# Patient Record
Sex: Female | Born: 1955 | Race: White | Hispanic: No | Marital: Single | Smoking: Current every day smoker
Health system: Southern US, Community
[De-identification: ages and names within clinical notes are randomized; demographics above are authoritative.]

## PROBLEM LIST (undated history)

## (undated) DIAGNOSIS — D490 Neoplasm of unspecified behavior of digestive system: Secondary | ICD-10-CM

## (undated) DIAGNOSIS — F419 Anxiety disorder, unspecified: Secondary | ICD-10-CM

## (undated) DIAGNOSIS — E222 Syndrome of inappropriate secretion of antidiuretic hormone: Secondary | ICD-10-CM

## (undated) DIAGNOSIS — I10 Essential (primary) hypertension: Secondary | ICD-10-CM

## (undated) DIAGNOSIS — F32A Depression, unspecified: Secondary | ICD-10-CM

## (undated) HISTORY — DX: Syndrome of inappropriate secretion of antidiuretic hormone: E22.2

## (undated) HISTORY — DX: Anxiety disorder, unspecified: F41.9

## (undated) HISTORY — DX: Neoplasm of unspecified behavior of digestive system: D49.0

## (undated) HISTORY — PX: MANDIBLE FRACTURE SURGERY: SHX706

## (undated) HISTORY — DX: Depression, unspecified: F32.A

## (undated) HISTORY — PX: LEEP: SHX91

## (undated) HISTORY — PX: COLONOSCOPY: SHX174

---

## 2004-05-29 DIAGNOSIS — A483 Toxic shock syndrome: Secondary | ICD-10-CM

## 2004-05-29 HISTORY — DX: Toxic shock syndrome: A48.3

## 2009-04-19 ENCOUNTER — Ambulatory Visit (HOSPITAL_COMMUNITY): Admission: RE | Admit: 2009-04-19 | Discharge: 2009-04-19 | Payer: Self-pay | Admitting: Internal Medicine

## 2009-06-16 ENCOUNTER — Other Ambulatory Visit: Admission: RE | Admit: 2009-06-16 | Discharge: 2009-06-16 | Payer: Self-pay | Admitting: Internal Medicine

## 2009-08-04 ENCOUNTER — Ambulatory Visit: Payer: Self-pay | Admitting: Gastroenterology

## 2009-08-04 DIAGNOSIS — I10 Essential (primary) hypertension: Secondary | ICD-10-CM | POA: Insufficient documentation

## 2009-08-04 DIAGNOSIS — D126 Benign neoplasm of colon, unspecified: Secondary | ICD-10-CM | POA: Insufficient documentation

## 2009-08-24 ENCOUNTER — Ambulatory Visit: Payer: Self-pay | Admitting: Gastroenterology

## 2009-08-24 ENCOUNTER — Ambulatory Visit (HOSPITAL_COMMUNITY): Admission: RE | Admit: 2009-08-24 | Discharge: 2009-08-24 | Payer: Self-pay | Admitting: Gastroenterology

## 2009-08-25 ENCOUNTER — Encounter (INDEPENDENT_AMBULATORY_CARE_PROVIDER_SITE_OTHER): Payer: Self-pay

## 2010-06-28 NOTE — Letter (Signed)
Summary: TCS ORDER  TCS ORDER   Imported By: Ave Filter 08/04/2009 09:56:04  _____________________________________________________________________  External Attachment:    Type:   Image     Comment:   External Document

## 2010-06-28 NOTE — Letter (Signed)
Summary: Patient Notice, Colon Biopsy Results  [facility]  [address]   [place], [place] [address]   Phone: [phone]  Fax: [phone]       August 25, 2009   [facility] [address] [place], [place]  [address] March 13, 1956    Dear Ms. Laursen,  I am pleased to inform you that the biopsies taken during your recent colonoscopy did not show any evidence of cancer upon pathologic examination.  Additional information/recommendations:  __Please follow a high fiber diet  __You should have a repeat colonoscopy examination  in 5 years.  Please call us if you are having persistent problems or have questions about your condition that have not been fully answered at this time.  Sincerely,    Hendricks Limes LPN  [facility] Ph: [phone]    Fax: [phone]

## 2010-06-28 NOTE — Assessment & Plan Note (Signed)
Summary: hx of polyps.glu   Visit Type:  Initial Consult Referring Provider:  [facility] Primary Care Provider:  [facility]  Chief Complaint:  Hx adenomatous polyps.  History of Present Illness: 55 y/o caucasian female due for surveillance colonoscopy.  1st colonoscopy 5 polyps removed in 1994 at [facility], 2nd colonoscopy 1999->normal.  Mother succumbed secondary to colon CA dx age 45.  The patient denies abdominal pain, nausea, vomiting, bloating, change in bowel habits, blood in stool, constipation, and diarrhea.  The patient denies any upper GI symptoms which include heartburn, indigestion, dysphagia, odynophagia, anorexia or early satiety.  Weight stable.   Current Problems (verified): 1)  Hypertension  (ICD-401.9) 2)  Colonic Polyps, Adenomatous  (ICD-211.3)  Current Medications (verified): 1)  Enalapril Maleate 20 Mg Tabs (Enalapril Maleate) .... 2 By Mouth Daily 2)  Trazodone Hcl .Marland Kitchen.. 1 By Mouth At Bedtime As Needed Insomnia 3)  Ibuprofen 200 Mg Tabs (Ibuprofen) .... Prn 4)  Multivitamins  Tabs (Multiple Vitamin) .Marland Kitchen.. 1 By Mouth Daily 5)  Vit C .... 1 By Mouth Daily 6)  Refresh 1.4-0.6 % Soln (Polyvinyl Alcohol-Povidone) .... Prn  Allergies (verified): No Known Drug Allergies  Past History:  Past Medical History: HYPERTENSION (ICD-401.9) COLONIC POLYPS, ADENOMATOUS (ICD-211.3) see HPI  Past Surgical History: Fx jaw age 45  Family History: Mother succumber colon CA age 41 Father (deceased 68)  LUNG ca? 1 Sister, brother->healthy  Social History: single, lives alone Work [employer] facility Hx alcohol abuse Remote hx marijuana use 20 pkyr hx tobacco abuse    Review of Systems      See HPI General:  Denies fever, chills, sweats, anorexia, fatigue, weakness, malaise, weight loss, and sleep disorder. CV:  Denies chest pains, angina, palpitations, syncope, dyspnea on exertion, orthopnea, PND, peripheral edema, and claudication. Resp:   Denies dyspnea at rest, dyspnea with exercise, cough, sputum, wheezing, coughing up blood, and pleurisy. GI:  Denies difficulty swallowing, pain on swallowing, jaundice, and fecal incontinence. GU:  Denies urinary burning, blood in urine, nocturnal urination, urinary frequency, and urinary incontinence. Derm:  Denies rash, itching, dry skin, hives, moles, warts, and unhealing ulcers. Psych:  Denies depression, anxiety, memory loss, suicidal ideation, hallucinations, paranoia, phobia, and confusion. Heme:  Denies bruising, bleeding, and enlarged lymph nodes.  Vital Signs:  Patient profile:   55 year old female Height:      67 inches Weight:      129 pounds BMI:     20.28 Temp:     97.9 degrees F Pulse rate:   60 / minute BP supine:   106 / 78  Physical Exam  General:  Well developed, well nourished, no acute distress. Head:  Normocephalic and atraumatic. Eyes:  Sclera clear, no icterus. Ears:  Normal auditory acuity. Nose:  No deformity, discharge,  or lesions. Mouth:  No deformity or lesions, dentition normal. Neck:  Supple; no masses or thyromegaly. Chest Wall:  Symmetrical;  no deformities or tenderness. Lungs:  Clear throughout to auscultation. Heart:  Regular rate and rhythm; no murmurs, rubs,  or bruits. Abdomen:  Soft, nontender and nondistended. No masses, hepatosplenomegaly or hernias noted. Normal bowel sounds.without guarding and without rebound.   Msk:  Symmetrical with no gross deformities. Normal posture. Pulses:  Normal pulses noted. Extremities:  No clubbing, cyanosis, edema or deformities noted. Neurologic:  Alert and  oriented x4;  grossly normal neurologically. Skin:  Intact without significant lesions or rashes. Cervical Nodes:  No significant cervical adenopathy. Psych:  Alert  and cooperative. Normal mood and affect.  Impression & Recommendations:  Problem # 1:  Hx COLONIC POLYPS, ADENOMATOUS (ICD-211.3) 55 y/o caucasian female w/ hx polyps and family  history colon cancer in her mother.  Overdue for colonoscopy.  Colonoscopy to be performed by Dr. Jonette Eva in the near future.  I have discussed risks and benefits which include, but are not limited to, bleeding, infection, perforation, or medication reaction.  The patient agrees with this plan and consent will be obtained.  PT REQUESTS NO IV's in LEFT ARM given hx problems during previous colonoscopy.  Orders: Consultation Level III [phone])

## 2012-12-04 ENCOUNTER — Emergency Department (HOSPITAL_COMMUNITY): Payer: Worker's Compensation

## 2012-12-04 ENCOUNTER — Encounter (HOSPITAL_COMMUNITY): Payer: Self-pay

## 2012-12-04 ENCOUNTER — Emergency Department (HOSPITAL_COMMUNITY)
Admission: EM | Admit: 2012-12-04 | Discharge: 2012-12-04 | Disposition: A | Discharge status: Home / Self Care | Payer: Worker's Compensation | Attending: Emergency Medicine | Admitting: Emergency Medicine

## 2012-12-04 DIAGNOSIS — R51 Headache: Secondary | ICD-10-CM | POA: Insufficient documentation

## 2012-12-04 DIAGNOSIS — R040 Epistaxis: Secondary | ICD-10-CM | POA: Insufficient documentation

## 2012-12-04 DIAGNOSIS — Y9229 Other specified public building as the place of occurrence of the external cause: Secondary | ICD-10-CM | POA: Insufficient documentation

## 2012-12-04 DIAGNOSIS — W07XXXA Fall from chair, initial encounter: Secondary | ICD-10-CM | POA: Insufficient documentation

## 2012-12-04 DIAGNOSIS — F17200 Nicotine dependence, unspecified, uncomplicated: Secondary | ICD-10-CM | POA: Insufficient documentation

## 2012-12-04 DIAGNOSIS — I10 Essential (primary) hypertension: Secondary | ICD-10-CM | POA: Insufficient documentation

## 2012-12-04 DIAGNOSIS — Y990 Civilian activity done for income or pay: Secondary | ICD-10-CM | POA: Insufficient documentation

## 2012-12-04 DIAGNOSIS — R5381 Other malaise: Secondary | ICD-10-CM | POA: Insufficient documentation

## 2012-12-04 DIAGNOSIS — S022XXA Fracture of nasal bones, initial encounter for closed fracture: Secondary | ICD-10-CM

## 2012-12-04 DIAGNOSIS — M542 Cervicalgia: Secondary | ICD-10-CM | POA: Insufficient documentation

## 2012-12-04 DIAGNOSIS — R5383 Other fatigue: Secondary | ICD-10-CM | POA: Insufficient documentation

## 2012-12-04 DIAGNOSIS — M6281 Muscle weakness (generalized): Secondary | ICD-10-CM | POA: Insufficient documentation

## 2012-12-04 DIAGNOSIS — Y9389 Activity, other specified: Secondary | ICD-10-CM | POA: Insufficient documentation

## 2012-12-04 DIAGNOSIS — M25579 Pain in unspecified ankle and joints of unspecified foot: Secondary | ICD-10-CM | POA: Insufficient documentation

## 2012-12-04 HISTORY — DX: Essential (primary) hypertension: I10

## 2012-12-04 MED ORDER — OXYCODONE-ACETAMINOPHEN 5-325 MG PO TABS: 1.0000 | ORAL_TABLET | ORAL | Status: DC | PRN

## 2012-12-04 NOTE — ED Notes (Signed)
Pt was working at [employer] and slipped off of chair.  Hit face on back of chair and had nose bleed from both nostrils.  C/O left foot feeling sore.  C/O headache.    Pt says feels "tired" and "groggy."

## 2012-12-04 NOTE — ED Provider Notes (Signed)
History  This chart was scribed for Joann Hutching, MD by Ardelia Mems, ED Scribe. This patient was seen in room APA02/APA02 and the patient's care was started at 4:01 PM.  CSN: [id number]  Arrival date & time 12/04/12  1531   No chief complaint on file.  The history is provided by the patient. No language interpreter was used.   HPI Comments: [name] is a 57 y.o. female with a hx of HTN who presents to the Emergency Department complaining of a nosebleed onset after an accidental fall that occurred earlier today, while at work. Pt states that she stepped up on a chair with wooden arms to take a wall clock off of the wall, lost her balance and fell, and hit the bridge of her nose on the wooden arms of the chair. Pt reports "gushing blood", which is now controlled. Pt states that co-workers applied ice and packed her nose to stop the bleeding. Pt also complains of constant, mild headache and neck pain and constant, mild left ankle pain, described as soreness, which she believes is related to the fall. Pt states that she ate well today, but reports feeling weak since the fall. Pt doesn't recall any blood running down her throat. Pt denies LOC, vomiting, visual disturbance, abdomina pain, back pain or any other symptoms. Pt denies alcohol use and is a current every day smoker.   PCP- None   Past Medical History  Diagnosis Date  . Hypertension    Past Surgical History  Procedure Laterality Date  . Colonoscopy    . Mandible fracture surgery    . Leep     No family history on file. History  Substance Use Topics  . Smoking status: Current Every Day Smoker  . Smokeless tobacco: Not on file  . Alcohol Use: No   OB History   Grav Para Term Preterm Abortions TAB SAB Ect Mult Living                 Review of Systems  Constitutional: Positive for fatigue. Negative for fever and chills.  HENT: Positive for nosebleeds and neck pain. Negative for congestion, sore throat and  rhinorrhea.   Eyes: Negative for visual disturbance.  Respiratory: Negative for cough and shortness of breath.   Cardiovascular: Negative for chest pain.  Gastrointestinal: Negative for nausea, vomiting, abdominal pain and diarrhea.  Genitourinary: Negative for dysuria.  Musculoskeletal: Negative for back pain.  Skin: Negative for rash.  Neurological: Positive for weakness and headaches. Negative for syncope.  Psychiatric/Behavioral: Negative for confusion.  A complete 10 system review of systems was obtained and all systems are negative except as noted in the HPI and PMH.   Allergies  Review of patient's allergies indicates no known allergies.  Home Medications  No current outpatient prescriptions on file.  Triage Vitals: BP 129/81  Pulse 80  Temp(Src) 98.2 F (36.8 C) (Oral)  Resp 18  Ht 5\' 7"  (1.702 m)  Wt 140 lb (63.504 kg)  BMI 21.92 kg/m2  SpO2 100%  Physical Exam  Nursing note and vitals reviewed. Constitutional: She is oriented to person, place, and time. She appears well-developed and well-nourished.  HENT:  Head: Normocephalic and atraumatic.  Mild tenderness of nasal bridge to palpation. Coagulated blood, more in right nares than left.  Eyes: Conjunctivae and EOM are normal. Pupils are equal, round, and reactive to light.  Neck: Normal range of motion. Neck supple.  Mild tenderness of C-spine to palpation.  Cardiovascular: Normal rate, regular  rhythm and normal heart sounds.   Pulmonary/Chest: Effort normal and breath sounds normal.  Abdominal: Soft. Bowel sounds are normal.  Musculoskeletal: Normal range of motion.  Minimal tenderness around left ankle.   Neurological: She is alert and oriented to person, place, and time.  Skin: Skin is warm and dry.  Psychiatric: She has a normal mood and affect.    ED Course  Procedures (including critical care time)  DIAGNOSTIC STUDIES: Oxygen Saturation is 100% on RA, normal by my interpretation.    COORDINATION  OF CARE: 4:07 PM- Pt advised of plan to receive maxillofacial, cervical and head CTs as well as radiology of her left ankle and pt agrees.  6:50 PM- Pt informed of radiology findings, specifically her nasal bon fracture. Pt advised to follow-up with an ENT and pt agrees.   Labs Reviewed - No data to display  Dg Ankle Complete Left  12/04/2012   *RADIOLOGY REPORT*  Clinical Data: Twisting injury.  Pain.  LEFT ANKLE COMPLETE - 3+ VIEW  Comparison: None.  Findings: No fracture or dislocation.  IMPRESSION: No fracture.   Original Report Authenticated By: Lacy Duverney, M.D.   Ct Head Wo Contrast  12/04/2012   *RADIOLOGY REPORT*  Clinical Data:  Fall.  Nose bleed.  CT HEAD WITHOUT CONTRAST CT MAXILLOFACIAL WITHOUT CONTRAST CT CERVICAL SPINE WITHOUT CONTRAST  Technique:  Multidetector CT imaging of the head, cervical spine, and maxillofacial structures were performed using the standard protocol without intravenous contrast. Multiplanar CT image reconstructions of the cervical spine and maxillofacial structures were also generated.  Comparison:   None  CT HEAD  Findings: No acute intracranial abnormality.  Specifically, no hemorrhage, hydrocephalus, mass lesion, acute infarction, or significant intracranial injury.  No acute calvarial abnormality. Visualized paranasal sinuses and mastoids clear.  Orbital soft tissues unremarkable.  IMPRESSION: No acute intracranial abnormality.  CT MAXILLOFACIAL  Findings:  Mild mucosal thickening within the left maxillary sinus. No air fluid levels.  Nasal bone fractures are noted, slightly displaced and angulated on the right.  Zygomatic arches and mandible are intact.  Orbital soft tissues unremarkable.  IMPRESSION: Mildly displaced and angulated nasal bone fractures.  CT CERVICAL SPINE  Findings:   Loss of normal cervical lordosis.  Degenerative disc disease changes at C5-6.  Mild degenerative facet disease at this level bilaterally.  No fracture.  No epidural or paraspinal  hematoma.  No subluxation.  Prevertebral soft tissues are normal.  IMPRESSION: Mild degenerative changes.  No acute findings.   Original Report Authenticated By: Charlett Nose, M.D.   Ct Cervical Spine Wo Contrast  12/04/2012   *RADIOLOGY REPORT*  Clinical Data:  Fall.  Nose bleed.  CT HEAD WITHOUT CONTRAST CT MAXILLOFACIAL WITHOUT CONTRAST CT CERVICAL SPINE WITHOUT CONTRAST  Technique:  Multidetector CT imaging of the head, cervical spine, and maxillofacial structures were performed using the standard protocol without intravenous contrast. Multiplanar CT image reconstructions of the cervical spine and maxillofacial structures were also generated.  Comparison:   None  CT HEAD  Findings: No acute intracranial abnormality.  Specifically, no hemorrhage, hydrocephalus, mass lesion, acute infarction, or significant intracranial injury.  No acute calvarial abnormality. Visualized paranasal sinuses and mastoids clear.  Orbital soft tissues unremarkable.  IMPRESSION: No acute intracranial abnormality.  CT MAXILLOFACIAL  Findings:  Mild mucosal thickening within the left maxillary sinus. No air fluid levels.  Nasal bone fractures are noted, slightly displaced and angulated on the right.  Zygomatic arches and mandible are intact.  Orbital soft tissues unremarkable.  IMPRESSION:  Mildly displaced and angulated nasal bone fractures.  CT CERVICAL SPINE  Findings:   Loss of normal cervical lordosis.  Degenerative disc disease changes at C5-6.  Mild degenerative facet disease at this level bilaterally.  No fracture.  No epidural or paraspinal hematoma.  No subluxation.  Prevertebral soft tissues are normal.  IMPRESSION: Mild degenerative changes.  No acute findings.   Original Report Authenticated By: Charlett Nose, M.D.   Ct Maxillofacial Wo Cm  12/04/2012   *RADIOLOGY REPORT*  Clinical Data:  Fall.  Nose bleed.  CT HEAD WITHOUT CONTRAST CT MAXILLOFACIAL WITHOUT CONTRAST CT CERVICAL SPINE WITHOUT CONTRAST  Technique:   Multidetector CT imaging of the head, cervical spine, and maxillofacial structures were performed using the standard protocol without intravenous contrast. Multiplanar CT image reconstructions of the cervical spine and maxillofacial structures were also generated.  Comparison:   None  CT HEAD  Findings: No acute intracranial abnormality.  Specifically, no hemorrhage, hydrocephalus, mass lesion, acute infarction, or significant intracranial injury.  No acute calvarial abnormality. Visualized paranasal sinuses and mastoids clear.  Orbital soft tissues unremarkable.  IMPRESSION: No acute intracranial abnormality.  CT MAXILLOFACIAL  Findings:  Mild mucosal thickening within the left maxillary sinus. No air fluid levels.  Nasal bone fractures are noted, slightly displaced and angulated on the right.  Zygomatic arches and mandible are intact.  Orbital soft tissues unremarkable.  IMPRESSION: Mildly displaced and angulated nasal bone fractures.  CT CERVICAL SPINE  Findings:   Loss of normal cervical lordosis.  Degenerative disc disease changes at C5-6.  Mild degenerative facet disease at this level bilaterally.  No fracture.  No epidural or paraspinal hematoma.  No subluxation.  Prevertebral soft tissues are normal.  IMPRESSION: Mild degenerative changes.  No acute findings.   Original Report Authenticated By: Charlett Nose, M.D.    No diagnosis found.  MDM  Pt is alert and oriented with no neuro deficits.   Discussed nasal fracture.    Patient will followup with otolaryngologist.   Pain medication         I personally performed the services described in this documentation, which was scribed in my presence. The recorded information has been reviewed and is accurate.    Joann Hutching, MD 12/04/12 [phone]

## 2012-12-04 NOTE — ED Notes (Signed)
Pt answers questions appropriately but says doesn't feel like herself and isn't talking like normal.  Pt also requested that she not be given any narcotics.

## 2012-12-04 NOTE — ED Notes (Signed)
Patient refused prescription of percocet. Notified md. Placed in scredder

## 2013-10-14 ENCOUNTER — Other Ambulatory Visit (HOSPITAL_COMMUNITY): Payer: Self-pay | Admitting: Physician Assistant

## 2013-10-14 DIAGNOSIS — Z1231 Encounter for screening mammogram for malignant neoplasm of breast: Secondary | ICD-10-CM

## 2014-03-23 ENCOUNTER — Ambulatory Visit (HOSPITAL_COMMUNITY)
Admission: RE | Admit: 2014-03-23 | Discharge: 2014-03-23 | Disposition: A | Discharge status: Home / Self Care | Payer: Self-pay | Source: Ambulatory Visit | Attending: Physician Assistant | Admitting: Physician Assistant

## 2014-03-23 ENCOUNTER — Ambulatory Visit (HOSPITAL_COMMUNITY): Payer: Self-pay

## 2014-03-23 DIAGNOSIS — Z1231 Encounter for screening mammogram for malignant neoplasm of breast: Secondary | ICD-10-CM

## 2014-07-20 IMAGING — CT CT HEAD W/O CM
4 of 9 series · 15 of 47 positions shown, 18 images · non-contrast
Comparison: None

CT HEAD

CLINICAL DATA: Fall.  Nose bleed.

CT HEAD WITHOUT CONTRAST
CT MAXILLOFACIAL WITHOUT CONTRAST
CT CERVICAL SPINE WITHOUT CONTRAST
TECHNIQUE: Multidetector CT imaging of the head, cervical spine,
and maxillofacial structures were performed using the standard
protocol without intravenous contrast. Multiplanar CT image
reconstructions of the cervical spine and maxillofacial structures
were also generated.

[Series 4: max soft 2.0 h31s · axial · 0.37mm/px · z∈[+880,+1020]mm · 9 of 118 slices shown, 12 images]
[im 12/118  brain]
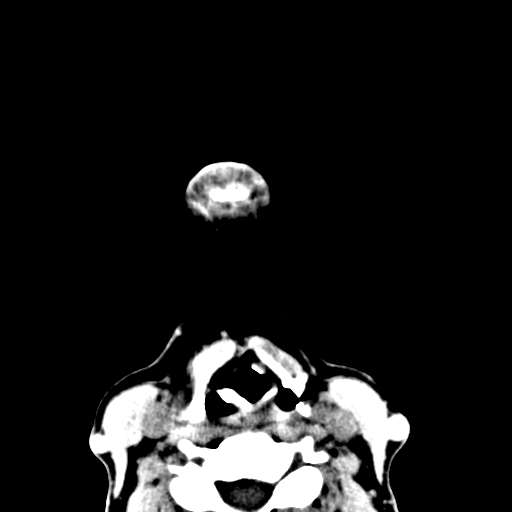
[im 12/118  bone]
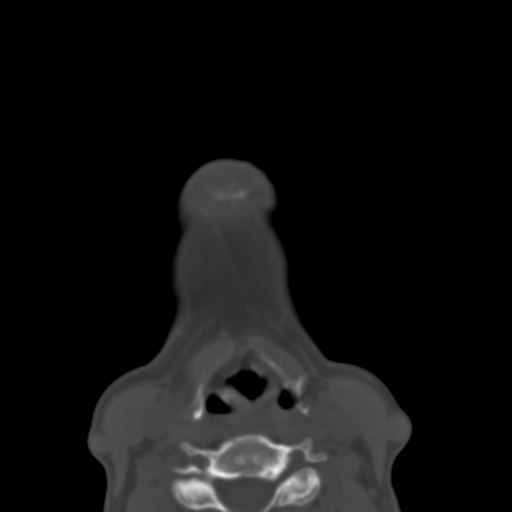
[im 24/118  brain]
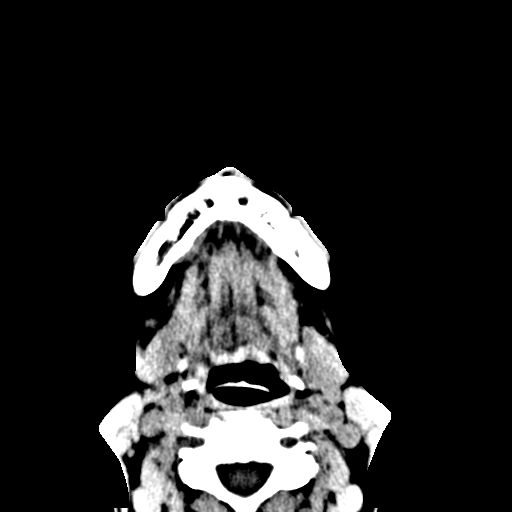
[im 36/118  brain]
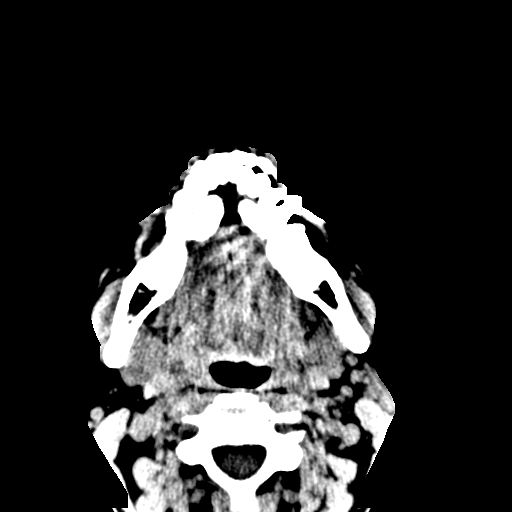
[im 47/118  brain]
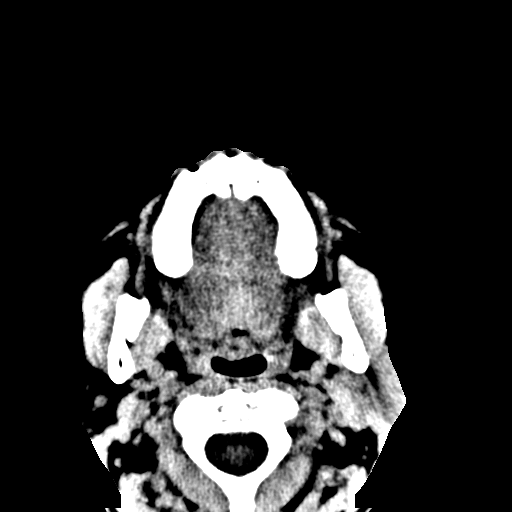
[im 59/118  brain]
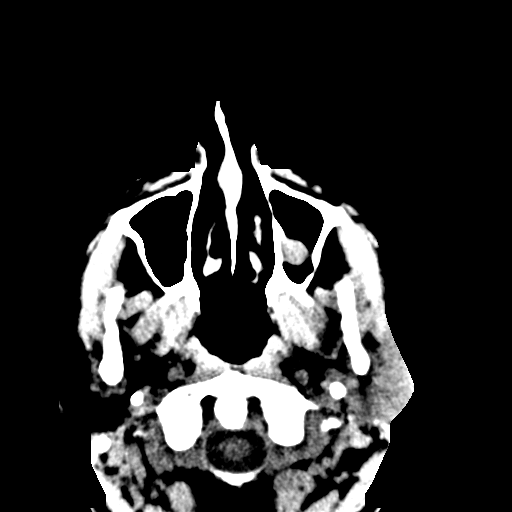
[im 59/118  bone]
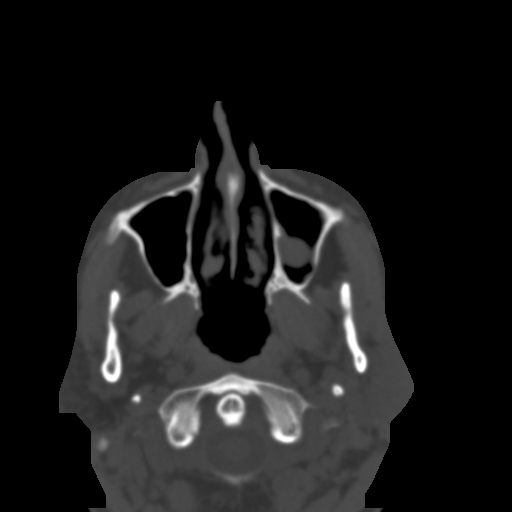
[im 71/118  brain]
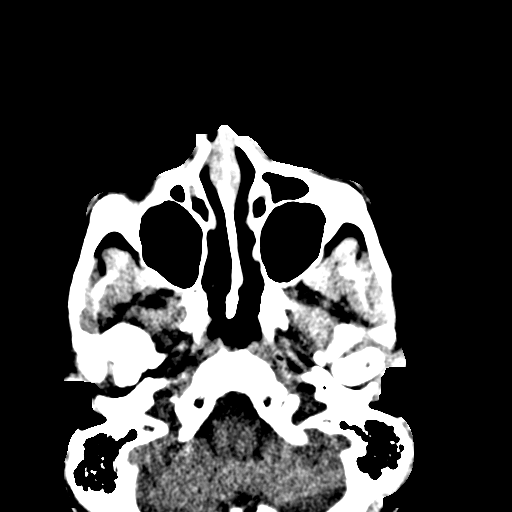
[im 82/118  brain]
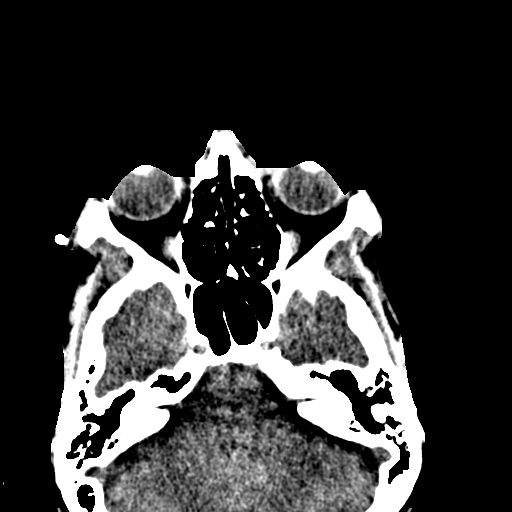
[im 94/118  brain]
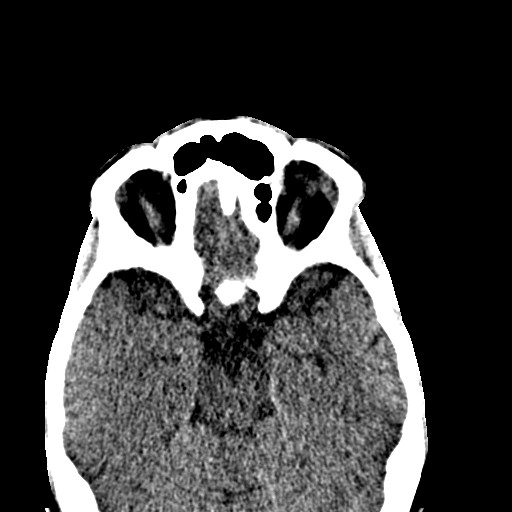
[im 106/118  brain]
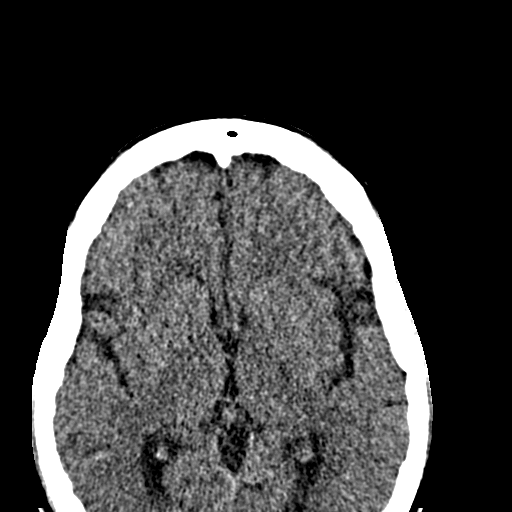
[im 106/118  bone]
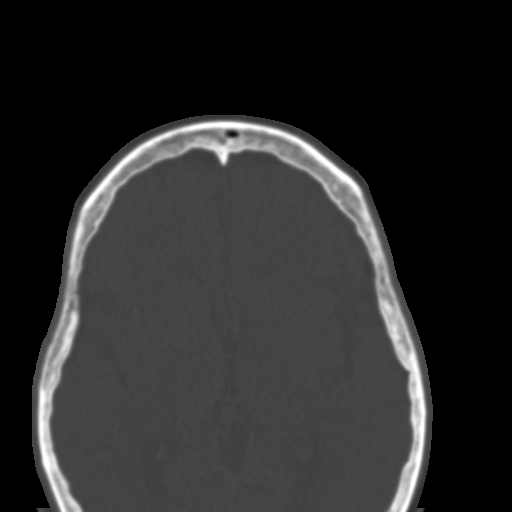

[Series 6: max st coronal · coronal · 0.30mm/px · 3 of 78 slices shown]
[im 23/78  brain]
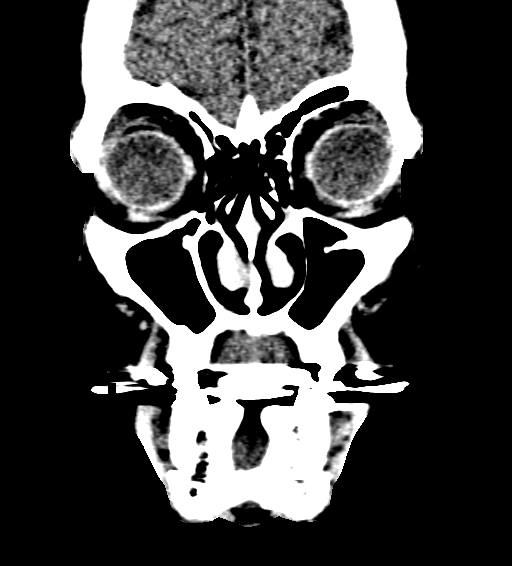
[im 35/78  brain]
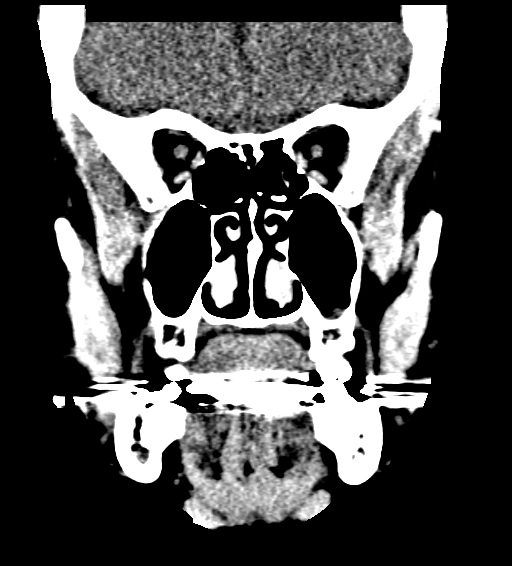
[im 46/78  brain]
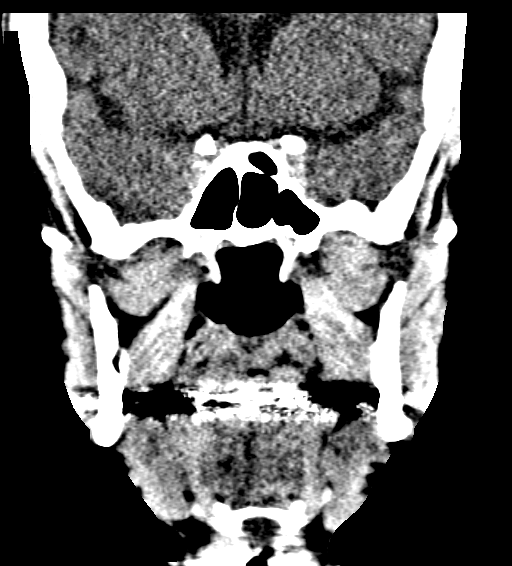

[Series 9: max bone sagittal 2.0 spo · sagittal · 0.32mm/px · 1 of 95 slices shown]
[im 48/95  brain]
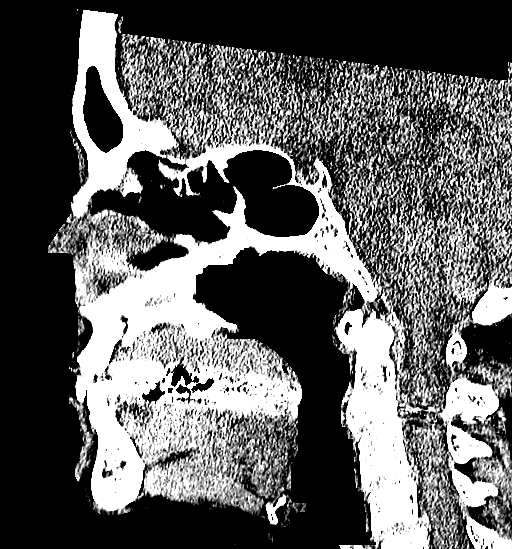

[Series 15: axial bone 2.0 · axial · 0.17mm/px · z∈[+817,+839]mm · 2 of 85 slices shown]
[im 13/85  bone]
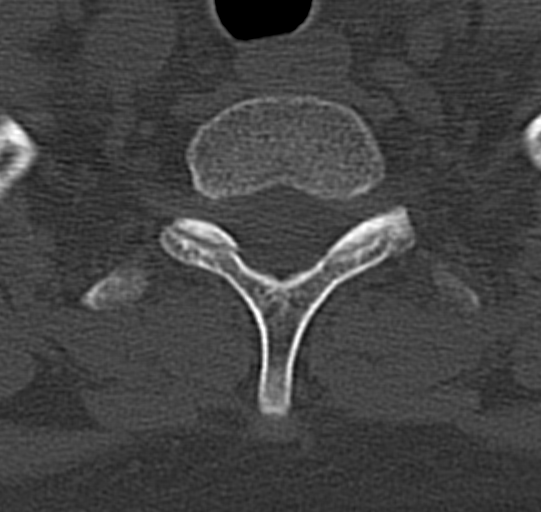
[im 25/85  bone]
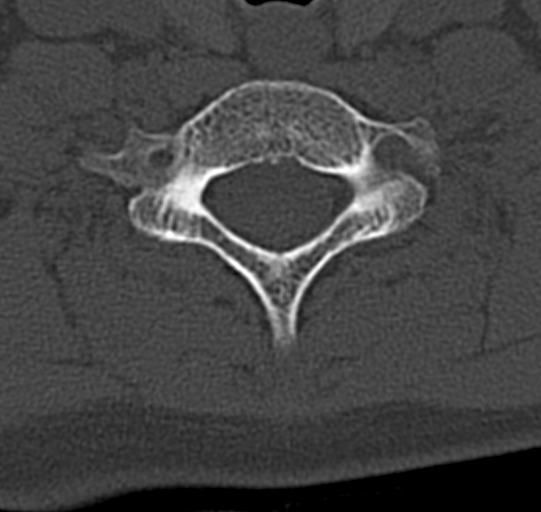

[15 of 47 positions shown; findings below may reference images not displayed]

FINDINGS: No acute intracranial abnormality.  Specifically, no
hemorrhage, hydrocephalus, mass lesion, acute infarction, or
significant intracranial injury.  No acute calvarial abnormality.
Visualized paranasal sinuses and mastoids clear.  Orbital soft
tissues unremarkable.
IMPRESSION: No acute intracranial abnormality.

CT MAXILLOFACIAL
FINDINGS: Mild mucosal thickening within the left maxillary sinus.
No air fluid levels.

Nasal bone fractures are noted, slightly displaced and angulated on
the right.

Zygomatic arches and mandible are intact.  Orbital soft tissues
unremarkable.
IMPRESSION: Mildly displaced and angulated nasal bone fractures.

CT CERVICAL SPINE
FINDINGS: Loss of normal cervical lordosis.  Degenerative disc
disease changes at C5-6.  Mild degenerative facet disease at this
level bilaterally.  No fracture.  No epidural or paraspinal
hematoma.  No subluxation.  Prevertebral soft tissues are normal.
IMPRESSION: Mild degenerative changes.  No acute findings.

## 2014-07-27 ENCOUNTER — Encounter: Payer: Self-pay | Admitting: Gastroenterology

## 2015-03-03 ENCOUNTER — Encounter: Payer: Self-pay | Admitting: Physician Assistant

## 2015-03-03 ENCOUNTER — Ambulatory Visit: Payer: Self-pay | Admitting: Physician Assistant

## 2015-03-03 VITALS — BP 112/76 | HR 66 | Temp 97.7°F | Ht 67.0 in | Wt 141.8 lb

## 2015-03-03 DIAGNOSIS — R7309 Other abnormal glucose: Secondary | ICD-10-CM

## 2015-03-03 DIAGNOSIS — Z13220 Encounter for screening for lipoid disorders: Secondary | ICD-10-CM

## 2015-03-03 DIAGNOSIS — Z86010 Personal history of colonic polyps: Secondary | ICD-10-CM

## 2015-03-03 DIAGNOSIS — D649 Anemia, unspecified: Secondary | ICD-10-CM

## 2015-03-03 MED ORDER — LISINOPRIL 10 MG PO TABS: 10.0000 mg | ORAL_TABLET | Freq: Every day | ORAL | Status: AC

## 2015-03-03 NOTE — Patient Instructions (Signed)
Smoking Cessation, Tips for Success If you are ready to quit smoking, congratulations! You have chosen to help yourself be healthier. Cigarettes bring nicotine, tar, carbon monoxide, and other irritants into your body. Your lungs, heart, and blood vessels will be able to work better without these poisons. There are many different ways to quit smoking. Nicotine gum, nicotine patches, a nicotine inhaler, or nicotine nasal spray can help with physical craving. Hypnosis, support groups, and medicines help break the habit of smoking. WHAT THINGS CAN I DO TO MAKE QUITTING EASIER?  Here are some tips to help you quit for good:  Pick a date when you will quit smoking completely. Tell all of your friends and family about your plan to quit on that date.  Do not try to slowly cut down on the number of cigarettes you are smoking. Pick a quit date and quit smoking completely starting on that day.  Throw away all cigarettes.   Clean and remove all ashtrays from your home, work, and car.  On a card, write down your reasons for quitting. Carry the card with you and read it when you get the urge to smoke.  Cleanse your body of nicotine. Drink enough water and fluids to keep your urine clear or pale yellow. Do this after quitting to flush the nicotine from your body.  Learn to predict your moods. Do not let a bad situation be your excuse to have a cigarette. Some situations in your life might tempt you into wanting a cigarette.  Never have "just one" cigarette. It leads to wanting another and another. Remind yourself of your decision to quit.  Change habits associated with smoking. If you smoked while driving or when feeling stressed, try other activities to replace smoking. Stand up when drinking your coffee. Brush your teeth after eating. Sit in a different chair when you read the paper. Avoid alcohol while trying to quit, and try to drink fewer caffeinated beverages. Alcohol and caffeine may urge you to  smoke.  Avoid foods and drinks that can trigger a desire to smoke, such as sugary or spicy foods and alcohol.  Ask people who smoke not to smoke around you.  Have something planned to do right after eating or having a cup of coffee. For example, plan to take a walk or exercise.  Try a relaxation exercise to calm you down and decrease your stress. Remember, you may be tense and nervous for the first 2 weeks after you quit, but this will pass.  Find new activities to keep your hands busy. Play with a pen, coin, or rubber band. Doodle or draw things on paper.  Brush your teeth right after eating. This will help cut down on the craving for the taste of tobacco after meals. You can also try mouthwash.   Use oral substitutes in place of cigarettes. Try using lemon drops, carrots, cinnamon sticks, or chewing gum. Keep them handy so they are available when you have the urge to smoke.  When you have the urge to smoke, try deep breathing.  Designate your home as a nonsmoking area.  If you are a heavy smoker, ask your health care provider about a prescription for nicotine chewing gum. It can ease your withdrawal from nicotine.  Reward yourself. Set aside the cigarette money you save and buy yourself something nice.  Look for support from others. Join a support group or smoking cessation program. Ask someone at home or at work to help you with your plan   to quit smoking.  Always ask yourself, "Do I need this cigarette or is this just a reflex?" Tell yourself, "Today, I choose not to smoke," or "I do not want to smoke." You are reminding yourself of your decision to quit.  Do not replace cigarette smoking with electronic cigarettes (commonly called e-cigarettes). The safety of e-cigarettes is unknown, and some may contain harmful chemicals.  If you relapse, do not give up! Plan ahead and think about what you will do the next time you get the urge to smoke. HOW WILL I FEEL WHEN I QUIT SMOKING? You  may have symptoms of withdrawal because your body is used to nicotine (the addictive substance in cigarettes). You may crave cigarettes, be irritable, feel very hungry, cough often, get headaches, or have difficulty concentrating. The withdrawal symptoms are only temporary. They are strongest when you first quit but will go away within 10-14 days. When withdrawal symptoms occur, stay in control. Think about your reasons for quitting. Remind yourself that these are signs that your body is healing and getting used to being without cigarettes. Remember that withdrawal symptoms are easier to treat than the major diseases that smoking can cause.  Even after the withdrawal is over, expect periodic urges to smoke. However, these cravings are generally short lived and will go away whether you smoke or not. Do not smoke! WHAT RESOURCES ARE AVAILABLE TO HELP ME QUIT SMOKING? Your health care provider can direct you to community resources or hospitals for support, which may include:  Group support.  Education.  Hypnosis.  Therapy.   This information is not intended to replace advice given to you by your health care provider. Make sure you discuss any questions you have with your health care provider.   Document Released: 02/11/2004 Document Revised: 06/05/2014 Document Reviewed: 10/31/2012 Elsevier Interactive Patient Education 2016 [employer]  

## 2015-03-03 NOTE — Progress Notes (Signed)
   BP 112/76 mmHg  Pulse 66  Temp(Src) 97.7 F (36.5 C)  Ht 5\' 7"  (1.702 m)  Wt 141 lb 12.8 oz (64.32 kg)  BMI 22.20 kg/m2  SpO2 99%   Subjective:    Patient ID: Joann Mckay, female    DOB: 1955/10/18, 59 y.o.   MRN: [id number]  HPI: Joann Mckay is a 59 y.o. female presenting on 03/03/2015 for Dizziness and Follow-up   HPI   Pt only had the one episode of dizziness.  She is feeling good today  Pt turned in her cone disc app but she hasn't heard on it.   Chief Complaint  Patient presents with  . Dizziness    pt experienced vertigo for the first time 1 month ago and it made her nauseous and caused her to vomit. pt states it lasted all day. pt states she took dramamine that her friend gave her and pt states it was helpful  . Follow-up    pt was told she was borderline diabetic on last appt and pt states she did not believe it. pt thinks she should get lab work drawn again.      Relevant past medical, surgical, family and social history reviewed and updated as indicated. Interim medical history since our last visit reviewed. Allergies and medications reviewed and updated.  Review of Systems  Per HPI unless specifically indicated above     Objective:    BP 112/76 mmHg  Pulse 66  Temp(Src) 97.7 F (36.5 C)  Ht 5\' 7"  (1.702 m)  Wt 141 lb 12.8 oz (64.32 kg)  BMI 22.20 kg/m2  SpO2 99%  Wt Readings from Last 3 Encounters:  03/03/15 141 lb 12.8 oz (64.32 kg)  12/04/12 140 lb (63.504 kg)  08/04/09 129 lb (58.514 kg)    Physical Exam  Constitutional: She is oriented to person, place, and time. She appears well-developed and well-nourished.  HENT:  Head: Normocephalic and atraumatic.  Neck: Neck supple.  Cardiovascular: Normal rate and regular rhythm.   Pulmonary/Chest: Effort normal and breath sounds normal.  Abdominal: Soft. Bowel sounds are normal. She exhibits no mass. There is no tenderness.  Musculoskeletal: She exhibits no edema.  Lymphadenopathy:     She has no cervical adenopathy.  Neurological: She is alert and oriented to person, place, and time.  Skin: Skin is warm and dry.  Psychiatric: She has a normal mood and affect. Her behavior is normal.  Vitals reviewed.   No results found for this or any previous visit.    Assessment & Plan:   Encounter Diagnoses  Name Primary?  . Other abnormal glucose Yes  . Anemia, unspecified anemia type   . Screening cholesterol level   . History of colonic polyps     -D/c enaliapril and replace with lisinopril due to cost -Check labs -Send to GI for repeat (past due for repeat- (pt turned in cone disc appl in about April) -counseled on smoking cessation -f/u 2 mo. RTO sooner prn

## 2015-03-10 LAB — LIPID PANEL
Cholesterol: 191 mg/dL (ref 125–200)
HDL: 70 mg/dL (ref >=46)
LDL Cholesterol: 108 mg/dL (ref <130)
Total CHOL/HDL Ratio: 2.7 Ratio (ref <=5.0)
Triglycerides: 63 mg/dL (ref <150)
VLDL: 13 mg/dL (ref <30)

## 2015-03-10 LAB — HEMOGLOBIN: Hemoglobin: 12.7 g/dL (ref 12.0–15.0)

## 2015-03-10 LAB — HEMOGLOBIN A1C
Hgb A1c MFr Bld: 6.1 % — ABNORMAL HIGH (ref <5.7)
Mean Plasma Glucose: 128 mg/dL — ABNORMAL HIGH (ref <117)

## 2015-04-28 ENCOUNTER — Ambulatory Visit: Payer: Self-pay | Admitting: Physician Assistant

## 2015-05-17 ENCOUNTER — Other Ambulatory Visit: Payer: Self-pay | Admitting: Physician Assistant

## 2015-06-18 ENCOUNTER — Encounter: Payer: Self-pay | Admitting: Gastroenterology

## 2015-07-02 ENCOUNTER — Other Ambulatory Visit: Payer: Self-pay | Admitting: Physician Assistant

## 2015-07-07 ENCOUNTER — Other Ambulatory Visit: Payer: Self-pay | Admitting: Physician Assistant

## 2015-07-07 ENCOUNTER — Ambulatory Visit: Payer: Self-pay | Admitting: Gastroenterology

## 2015-07-16 ENCOUNTER — Other Ambulatory Visit: Payer: Self-pay | Admitting: Physician Assistant

## 2015-07-23 ENCOUNTER — Other Ambulatory Visit: Payer: Self-pay | Admitting: Physician Assistant

## 2017-02-14 ENCOUNTER — Encounter: Payer: Self-pay | Admitting: Physician Assistant

## 2017-02-14 ENCOUNTER — Ambulatory Visit: Payer: Self-pay | Admitting: Physician Assistant

## 2017-02-14 VITALS — BP 110/70 | HR 71 | Temp 97.9°F | Wt 139.4 lb

## 2017-02-14 DIAGNOSIS — R221 Localized swelling, mass and lump, neck: Secondary | ICD-10-CM

## 2017-02-14 DIAGNOSIS — G4700 Insomnia, unspecified: Secondary | ICD-10-CM

## 2017-02-14 DIAGNOSIS — M67431 Ganglion, right wrist: Secondary | ICD-10-CM

## 2017-02-14 DIAGNOSIS — E785 Hyperlipidemia, unspecified: Secondary | ICD-10-CM | POA: Insufficient documentation

## 2017-02-14 DIAGNOSIS — Z86010 Personal history of colonic polyps: Secondary | ICD-10-CM

## 2017-02-14 DIAGNOSIS — F17210 Nicotine dependence, cigarettes, uncomplicated: Secondary | ICD-10-CM

## 2017-02-14 DIAGNOSIS — I10 Essential (primary) hypertension: Secondary | ICD-10-CM

## 2017-02-14 NOTE — Progress Notes (Signed)
BP 110/70 (BP Location: Left Arm, Patient Position: Sitting, Cuff Size: Normal)   Pulse 71   Temp 97.9 F (36.6 C)   Wt 139 lb 6.4 oz (63.2 kg)   SpO2 99%   BMI 21.83 kg/m    Subjective:    Patient ID: Joann Mckay, female    DOB: Sep 28, 1955, 61 y.o.   MRN: [id number]  HPI: Joann Mckay is a 61 y.o. female presenting on 02/14/2017 for Follow-up   HPI   I am seeing pt at [facility] as interim provider until new permanent provider takes over mid-October  Pt had elevated lipids in may but was not given rx.  She says she has always eaten lowfat diet.    Pt has seen ENT in June and still not gotten CT scan that was recommended for the lump/swelled lymph node behind her R ear.   Pt was told to have it cut out by ENT or it would turn into cancer but she has said no because she hasn't met her deductible.    Pt complains of cyst R wrist x 1 month. Nontender.  She does not remember bumping it or injuring it.  Pt is still "burned out" from her job at [employer] but says she hs been unsuccessful at finding a different job.  Pt did not contact her insurance company about her colonoscopy which is past due.  She was counseled to do this at her OV in May.   She still smokes and is not interested in stopping at this time.   Pt complains of still owing money to the lab for the last time she had labs drawn.   Relevant past medical, surgical, family and social history reviewed and updated as indicated. Interim medical history since our last visit reviewed. Allergies and medications reviewed and updated.   Current Outpatient Prescriptions:  .  Ascorbic Acid (VITAMIN C) 1000 MG tablet, Take 1,000 mg by mouth daily., Disp: , Rfl:  .  aspirin-sod bicarb-citric acid (ALKA-SELTZER) 325 MG TBEF tablet, Take 325 mg by mouth every 6 (six) hours as needed., Disp: , Rfl:  .  carboxymethylcellulose (REFRESH PLUS) 0.5 % SOLN, Place 1 drop into both eyes daily as needed (Dry Eyes).,  Disp: , Rfl:  .  ibuprofen (ADVIL,MOTRIN) 200 MG tablet, Take 200 mg by mouth daily as needed for headache., Disp: , Rfl:  .  lisinopril (PRINIVIL,ZESTRIL) 10 MG tablet, Take 1 tablet (10 mg total) by mouth daily., Disp: 30 tablet, Rfl: 1 .  Multiple Vitamins-Minerals (CENTRUM SILVER 50+WOMEN) TABS, Take 1 tablet by mouth daily., Disp: , Rfl:  .  olopatadine (PATANOL) 0.1 % ophthalmic solution, 1 drop 2 (two) times daily as needed for allergies., Disp: , Rfl:  .  traZODone (DESYREL) 50 MG tablet, Take 50 mg by mouth at bedtime as needed for sleep., Disp: , Rfl:   Review of Systems  Constitutional: Negative for appetite change, chills, diaphoresis, fatigue, fever and unexpected weight change.  HENT: Negative for congestion, dental problem, drooling, ear pain, facial swelling, hearing loss, mouth sores, sneezing, sore throat, trouble swallowing and voice change.   Eyes: Negative for pain, discharge, redness, itching and visual disturbance.  Respiratory: Negative for cough, choking, shortness of breath and wheezing.   Cardiovascular: Negative for chest pain, palpitations and leg swelling.  Gastrointestinal: Negative for abdominal pain, blood in stool, constipation, diarrhea and vomiting.  Endocrine: Negative for cold intolerance, heat intolerance and polydipsia.  Genitourinary: Negative for decreased urine volume,  dysuria and hematuria.  Musculoskeletal: Negative for arthralgias, back pain and gait problem.  Skin: Negative for rash.  Allergic/Immunologic: Negative for environmental allergies.  Neurological: Negative for seizures, syncope, light-headedness and headaches.  Hematological: Negative for adenopathy.  Psychiatric/Behavioral: Negative for agitation, dysphoric mood and suicidal ideas. The patient is not nervous/anxious.     Per HPI unless specifically indicated above     Objective:    BP 110/70 (BP Location: Left Arm, Patient Position: Sitting, Cuff Size: Normal)   Pulse 71   Temp  97.9 F (36.6 C)   Wt 139 lb 6.4 oz (63.2 kg)   SpO2 99%   BMI 21.83 kg/m   Wt Readings from Last 3 Encounters:  02/14/17 139 lb 6.4 oz (63.2 kg)  03/03/15 141 lb 12.8 oz (64.3 kg)  12/04/12 140 lb (63.5 kg)    Physical Exam  Constitutional: She is oriented to person, place, and time. She appears well-developed and well-nourished.  HENT:  Head: Normocephalic and atraumatic.  Neck: Neck supple.  Lump behind R ear.    Cardiovascular: Normal rate and regular rhythm.   Pulmonary/Chest: Effort normal and breath sounds normal.  Abdominal: Soft. Bowel sounds are normal. She exhibits no mass. There is no hepatosplenomegaly. There is no tenderness.  Musculoskeletal: She exhibits no edema.  Lump R wrist near distal ulna.  Appears to be ganglion cyst.  No redness. Not tender. Soft and freely mobile.   Lymphadenopathy:    She has no cervical adenopathy.  Neurological: She is alert and oriented to person, place, and time.  Skin: Skin is warm and dry.  Psychiatric: She has a normal mood and affect. Her behavior is normal.  Vitals reviewed.          Assessment & Plan:   Encounter Diagnoses  Name Primary?  . Essential hypertension Yes  . Hyperlipidemia, unspecified hyperlipidemia type   . Insomnia, unspecified type   . Cigarette nicotine dependence without complication   . Mass in neck   . Ganglion cyst of wrist, right   . Hx of colonic polyps      -No labs today -will Refer to orthopedist for ganglion cyst R wrist -Pt to call insurance about colonoscopy and schedule follow up with gastroenterology for repeat if needed -pt is urged to follow up with ENT for treatment of the cyst behind her R ear -no medication changes today -pt to follow up here in 3 months.  RTO sooner prn

## 2018-07-29 ENCOUNTER — Encounter: Payer: Self-pay | Admitting: Gastroenterology

## 2018-10-03 ENCOUNTER — Ambulatory Visit: Payer: Self-pay | Admitting: Nurse Practitioner

## 2018-12-11 ENCOUNTER — Encounter: Payer: Self-pay | Admitting: Nurse Practitioner

## 2018-12-11 ENCOUNTER — Other Ambulatory Visit: Payer: Self-pay

## 2018-12-11 ENCOUNTER — Ambulatory Visit: Payer: BC Managed Care – PPO | Admitting: Nurse Practitioner

## 2018-12-11 DIAGNOSIS — Z800 Family history of malignant neoplasm of digestive organs: Secondary | ICD-10-CM | POA: Diagnosis not present

## 2018-12-11 NOTE — Assessment & Plan Note (Signed)
Noted family history of colon cancer in her mother who was diagnosed at age 62 and passed away several weeks after diagnosis.  Last colonoscopy was in 2011 and found a tubular adenoma polyp.  Recommended 5-year repeat exam (2016) but this did not occur.  She is generally asymptomatic from a GI standpoint.  She is currently overdue for repeat colonoscopy.  We will proceed at this time.  Proceed with colonoscopy on propofol/MAC with Dr. Oneida Alar in the near future. The risks, benefits, and alternatives have been discussed in detail with the patient. They state understanding and desire to proceed.   The patient is currently on Celexa and trazodone.  No other anticoagulants, anxiolytics, chronic pain medications, or antidepressants.  We will plan for the procedure on propofol/MAC to promote adequate sedation.

## 2018-12-11 NOTE — Progress Notes (Signed)
Primary Care Physician:  Wyatt Haste, NP Primary Gastroenterologist:  Dr. Oneida Alar  Chief Complaint  Patient presents with  . Colonoscopy    [facility]    HPI:   Joann Mckay is a 63 y.o. female who presents on referral from primary care to update colonoscopy.  Nurse/phone triage was deferred office visit due to medications likely necessitating augmented sedation.  Patient has not been seen by our office since approximately 2011 when she had her last colonoscopy.  Colonoscopy was completed 08/24/2009 and found a tortuous colon, frequent sigmoid to transverse colon diverticula, a 3 mm sessile polyp in the ascending colon, otherwise normal.  Surgical pathology found the polyp to be tubular adenoma.  Recommended high-fiber diet and repeat colonoscopy in 5 years (2016).  The patient is now overdue.  Today she states she's doing well overall. Knows she's past-due for her colonoscopy. Denies abdominal pain, N/V, hematochezia, melena, fever, chills, unintentional weight loss other than loss of 10 lbs in April due to self-limiting diarrhea. Denies URI or flu-like symptoms. Denies loss of sense of taste or smell. Denies chest pain, dyspnea, dizziness, lightheadedness, syncope, near syncope. Denies any other upper or lower GI symptoms.  Past Medical History:  Diagnosis Date  . Anxiety   . Hypertension     Past Surgical History:  Procedure Laterality Date  . COLONOSCOPY    . LEEP    . MANDIBLE FRACTURE SURGERY      Current Outpatient Medications  Medication Sig Dispense Refill  . Ascorbic Acid (VITAMIN C) 1000 MG tablet Take 1,000 mg by mouth daily.    Marland Kitchen aspirin-sod bicarb-citric acid (ALKA-SELTZER) 325 MG TBEF tablet Take 325 mg by mouth every 6 (six) hours as needed.    . carboxymethylcellulose (REFRESH PLUS) 0.5 % SOLN Place 1 drop into both eyes daily as needed (Dry Eyes).    . citalopram (CELEXA) 10 MG tablet Take 1 tablet by mouth daily.    Marland Kitchen ibuprofen (ADVIL,MOTRIN) 200 MG  tablet Take 200 mg by mouth daily as needed for headache.    . lisinopril (PRINIVIL,ZESTRIL) 10 MG tablet Take 1 tablet (10 mg total) by mouth daily. 30 tablet 1  . Multiple Vitamins-Minerals (CENTRUM SILVER 50+WOMEN) TABS Take 1 tablet by mouth daily.    Marland Kitchen olopatadine (PATANOL) 0.1 % ophthalmic solution 1 drop 2 (two) times daily as needed for allergies.    . traZODone (DESYREL) 50 MG tablet Take 50 mg by mouth at bedtime as needed for sleep.     No current facility-administered medications for this visit.     Allergies as of 12/11/2018 - Review Complete 12/11/2018  Allergen Reaction Noted  . Nsaids Rash 03/03/2015    Family History  Problem Relation Age of Onset  . Colon cancer Mother 55       passed 6 weeks after diagnosis  . Cancer Father        lung    Social History   Socioeconomic History  . Marital status: Divorced    Spouse name: Not on file  . Number of children: Not on file  . Years of education: Not on file  . Highest education level: Not on file  Occupational History  . Not on file  Social Needs  . Financial resource strain: Not on file  . Food insecurity    Worry: Not on file    Inability: Not on file  . Transportation needs    Medical: Not on file    Non-medical: Not on  file  Tobacco Use  . Smoking status: Current Every Day Smoker    Packs/day: 0.50    Years: 38.00    Pack years: 19.00  . Smokeless tobacco: Never Used  Substance and Sexual Activity  . Alcohol use: No  . Drug use: No  . Sexual activity: Not on file  Lifestyle  . Physical activity    Days per week: Not on file    Minutes per session: Not on file  . Stress: Not on file  Relationships  . Social Herbalist on phone: Not on file    Gets together: Not on file    Attends religious service: Not on file    Active member of club or organization: Not on file    Attends meetings of clubs or organizations: Not on file    Relationship status: Not on file  . Intimate partner  violence    Fear of current or ex partner: Not on file    Emotionally abused: Not on file    Physically abused: Not on file    Forced sexual activity: Not on file  Other Topics Concern  . Not on file  Social History Narrative  . Not on file    Review of Systems: Complete ROS negative except as per HPI.    Physical Exam: BP 130/79   Pulse 79   Temp 98.5 F (36.9 C) (Oral)   Ht 5\' 7"  (1.702 m)   Wt 130 lb 12.8 oz (59.3 kg)   BMI 20.49 kg/m  General:   Alert and oriented. Pleasant and cooperative. Well-nourished and well-developed.  Head:  Normocephalic and atraumatic. Eyes:  Without icterus, sclera clear and conjunctiva pink.  Ears:  Normal auditory acuity. Mouth:  No deformity or lesions, oral mucosa pink.  Throat/Neck:  Supple, without mass or thyromegaly. Cardiovascular:  S1, S2 present without murmurs appreciated. Normal pulses noted. Extremities without clubbing or edema. Respiratory:  Clear to auscultation bilaterally. No wheezes, rales, or rhonchi. No distress.  Gastrointestinal:  +BS, soft, non-tender and non-distended. No HSM noted. No guarding or rebound. No masses appreciated.  Rectal:  Deferred  Musculoskalatal:  Symmetrical without gross deformities. Normal posture. Skin:  Intact without significant lesions or rashes. Neurologic:  Alert and oriented x4;  grossly normal neurologically. Psych:  Alert and cooperative. Normal mood and affect. Heme/Lymph/Immune: No significant cervical adenopathy. No excessive bruising noted.    12/11/2018 3:14 PM   Disclaimer: This note was dictated with voice recognition software. Similar sounding words can inadvertently be transcribed and may not be corrected upon review.

## 2018-12-11 NOTE — Patient Instructions (Signed)
Your health issues we discussed today were:   Need for colonoscopy: 1. We will schedule your colonoscopy for you 2. Further recommendations will be made after your colonoscopy  Overall I recommend:  1. Return for follow-up based on the recommendations made after your colonoscopy, or as needed 2. Call us if you have any questions or concerns.   Because of recent events of COVID-19 ("Coronavirus"), follow CDC recommendations:  1. Wash your hand frequently 2. Avoid touching your face 3. Stay away from people who are sick 4. If you have symptoms such as fever, cough, shortness of breath then call your healthcare provider for further guidance 5. If you are sick, STAY AT HOME unless otherwise directed by your healthcare provider. 6. Follow directions from state and national officials regarding staying safe   At [facility] we value your feedback. You may receive a survey about your visit today. Please share your experience as we strive to create trusting relationships with our patients to provide genuine, compassionate, quality care.  We appreciate your understanding and patience as we review any laboratory studies, imaging, and other diagnostic tests that are ordered as we care for you. Our office policy is 5 business days for review of these results, and any emergent or urgent results are addressed in a timely manner for your best interest. If you do not hear from our office in 1 week, please contact us.   We also encourage the use of MyChart, which contains your medical information for your review as well. If you are not enrolled in this feature, an access code is on this after visit summary for your convenience. Thank you for allowing [place] to be involved in your care.  It was great to see you today!  I hope you have a great summer!!

## 2018-12-13 NOTE — Progress Notes (Signed)
cc'd to pcp 

## 2018-12-17 ENCOUNTER — Other Ambulatory Visit: Payer: Self-pay

## 2018-12-17 ENCOUNTER — Telehealth: Payer: Self-pay

## 2018-12-17 ENCOUNTER — Telehealth: Payer: Self-pay | Admitting: Gastroenterology

## 2018-12-17 DIAGNOSIS — Z800 Family history of malignant neoplasm of digestive organs: Secondary | ICD-10-CM

## 2018-12-17 MED ORDER — PEG 3350-KCL-NA BICARB-NACL 420 G PO SOLR: 4000.0000 mL | ORAL | 0 refills | Status: DC

## 2018-12-17 NOTE — Telephone Encounter (Signed)
Pt was returning a call from MB. Please call her at [phone]

## 2018-12-17 NOTE — Telephone Encounter (Signed)
See other phone note for today. 

## 2018-12-17 NOTE — Telephone Encounter (Signed)
Spoke to pt, TCS scheduled for 03/11/19 at 7:30am. Rx for prep sent to pharmacy. Orders entered.

## 2018-12-17 NOTE — Telephone Encounter (Signed)
Tried to call pt to schedule TCS w/Propofol w/SLF, no answer, LMOVM for return call. Mobile# listed in chart was wrong number.

## 2018-12-17 NOTE — Telephone Encounter (Signed)
Pre-op appt 03/07/19 at 12:45pm. Letter mailed with procedure instructions.

## 2019-02-27 ENCOUNTER — Telehealth: Payer: Self-pay | Admitting: *Deleted

## 2019-02-27 NOTE — Telephone Encounter (Signed)
Patient called. She wants to cancel procedure for TCS with propofol. She did not want to reschedule. She stated she does not want to have it done right now. Called endo and LMOVM to cancel FYI to EG.

## 2019-02-28 NOTE — Telephone Encounter (Signed)
Noted  

## 2019-03-07 ENCOUNTER — Other Ambulatory Visit (HOSPITAL_COMMUNITY): Payer: BC Managed Care – PPO

## 2019-03-07 ENCOUNTER — Encounter (HOSPITAL_COMMUNITY): Admission: RE | Admit: 2019-03-07 | Payer: BC Managed Care – PPO | Source: Ambulatory Visit

## 2019-03-11 ENCOUNTER — Encounter (HOSPITAL_COMMUNITY): Admission: RE | Payer: Self-pay | Source: Home / Self Care

## 2019-03-11 ENCOUNTER — Ambulatory Visit (HOSPITAL_COMMUNITY)
Admission: RE | Admit: 2019-03-11 | Payer: BC Managed Care – PPO | Source: Home / Self Care | Admitting: Gastroenterology

## 2019-03-11 SURGERY — COLONOSCOPY WITH PROPOFOL
Anesthesia: Monitor Anesthesia Care

## 2019-03-30 HISTORY — PX: COLONOSCOPY: SHX174

## 2019-12-04 ENCOUNTER — Other Ambulatory Visit (HOSPITAL_COMMUNITY): Payer: Self-pay | Admitting: General Practice

## 2019-12-04 DIAGNOSIS — Z780 Asymptomatic menopausal state: Secondary | ICD-10-CM

## 2019-12-04 DIAGNOSIS — Z1231 Encounter for screening mammogram for malignant neoplasm of breast: Secondary | ICD-10-CM

## 2020-03-24 ENCOUNTER — Ambulatory Visit (HOSPITAL_COMMUNITY)
Admission: RE | Admit: 2020-03-24 | Discharge: 2020-03-24 | Disposition: A | Discharge status: Home / Self Care | Payer: 59 | Source: Ambulatory Visit | Attending: General Practice | Admitting: General Practice

## 2020-03-24 ENCOUNTER — Other Ambulatory Visit: Payer: Self-pay

## 2020-03-24 DIAGNOSIS — Z1231 Encounter for screening mammogram for malignant neoplasm of breast: Secondary | ICD-10-CM | POA: Diagnosis present

## 2020-08-09 ENCOUNTER — Other Ambulatory Visit (HOSPITAL_COMMUNITY): Payer: Self-pay | Admitting: Family Medicine

## 2020-08-09 DIAGNOSIS — M1990 Unspecified osteoarthritis, unspecified site: Secondary | ICD-10-CM

## 2020-08-16 ENCOUNTER — Other Ambulatory Visit: Payer: Self-pay

## 2020-08-16 ENCOUNTER — Ambulatory Visit (HOSPITAL_COMMUNITY)
Admission: RE | Admit: 2020-08-16 | Discharge: 2020-08-16 | Disposition: A | Discharge status: Home / Self Care | Payer: 59 | Source: Ambulatory Visit | Attending: Family Medicine | Admitting: Family Medicine

## 2020-08-16 DIAGNOSIS — M1990 Unspecified osteoarthritis, unspecified site: Secondary | ICD-10-CM | POA: Diagnosis present

## 2020-09-01 ENCOUNTER — Other Ambulatory Visit: Payer: Self-pay

## 2020-09-01 ENCOUNTER — Emergency Department (HOSPITAL_COMMUNITY)
Admission: EM | Admit: 2020-09-01 | Discharge: 2020-09-01 | Disposition: A | Discharge status: Home / Self Care | Payer: 59 | Attending: Emergency Medicine | Admitting: Emergency Medicine

## 2020-09-01 ENCOUNTER — Encounter (HOSPITAL_COMMUNITY): Payer: Self-pay

## 2020-09-01 ENCOUNTER — Emergency Department (HOSPITAL_COMMUNITY): Payer: 59

## 2020-09-01 DIAGNOSIS — F17200 Nicotine dependence, unspecified, uncomplicated: Secondary | ICD-10-CM | POA: Insufficient documentation

## 2020-09-01 DIAGNOSIS — Z7982 Long term (current) use of aspirin: Secondary | ICD-10-CM | POA: Insufficient documentation

## 2020-09-01 DIAGNOSIS — E871 Hypo-osmolality and hyponatremia: Secondary | ICD-10-CM | POA: Diagnosis not present

## 2020-09-01 DIAGNOSIS — R531 Weakness: Secondary | ICD-10-CM | POA: Diagnosis present

## 2020-09-01 DIAGNOSIS — I10 Essential (primary) hypertension: Secondary | ICD-10-CM | POA: Insufficient documentation

## 2020-09-01 DIAGNOSIS — Z79899 Other long term (current) drug therapy: Secondary | ICD-10-CM | POA: Insufficient documentation

## 2020-09-01 LAB — BASIC METABOLIC PANEL WITH GFR
Anion gap: 9 (ref 5–15)
BUN: 12 mg/dL (ref 8–23)
CO2: 24 mmol/L (ref 22–32)
Calcium: 9 mg/dL (ref 8.9–10.3)
Chloride: 99 mmol/L (ref 98–111)
Creatinine, Ser: 0.68 mg/dL (ref 0.44–1.00)
GFR, Estimated: >60 mL/min
Glucose, Bld: 99 mg/dL (ref 70–99)
Potassium: 4.7 mmol/L (ref 3.5–5.1)
Sodium: 132 mmol/L — ABNORMAL LOW (ref 135–145)

## 2020-09-01 LAB — CBC WITH DIFFERENTIAL/PLATELET
Abs Immature Granulocytes: 0.02 10*3/uL (ref 0.00–0.07)
Basophils Absolute: 0 10*3/uL (ref 0.0–0.1)
Basophils Relative: 1 %
Eosinophils Absolute: 0.1 10*3/uL (ref 0.0–0.5)
Eosinophils Relative: 2 %
HCT: 34.3 % — ABNORMAL LOW (ref 36.0–46.0)
Hemoglobin: 11.5 g/dL — ABNORMAL LOW (ref 12.0–15.0)
Immature Granulocytes: 0 %
Lymphocytes Relative: 30 %
Lymphs Abs: 2 10*3/uL (ref 0.7–4.0)
MCH: 32.7 pg (ref 26.0–34.0)
MCHC: 33.5 g/dL (ref 30.0–36.0)
MCV: 97.4 fL (ref 80.0–100.0)
Monocytes Absolute: 0.4 10*3/uL (ref 0.1–1.0)
Monocytes Relative: 6 %
Neutro Abs: 4 10*3/uL (ref 1.7–7.7)
Neutrophils Relative %: 61 %
Platelets: 277 10*3/uL (ref 150–400)
RBC: 3.52 MIL/uL — ABNORMAL LOW (ref 3.87–5.11)
RDW: 13.2 % (ref 11.5–15.5)
WBC: 6.6 10*3/uL (ref 4.0–10.5)
nRBC: 0 % (ref 0.0–0.2)

## 2020-09-01 LAB — TROPONIN I (HIGH SENSITIVITY)
Troponin I (High Sensitivity): 2 ng/L (ref <18)
Troponin I (High Sensitivity): 3 ng/L (ref <18)

## 2020-09-01 NOTE — ED Notes (Signed)
Pt ambulated to restroom. 

## 2020-09-01 NOTE — Discharge Instructions (Addendum)
Lab work and exam look reassuring, your sodium is slightly higher than it was 3 weeks ago.   recommend that you continue with your medications as prescribed.  Want you to follow-up with your PCP for further evaluation.  Come back to the emergency department if you develop chest pain, shortness of breath, severe abdominal pain, uncontrolled nausea, vomiting, diarrhea.

## 2020-09-01 NOTE — ED Triage Notes (Signed)
Pt to er, pt states that three weeks ago her pmd called her and told her that her sodium was 130, states that she blew this off, but yesterday she felt weak so she came to the er today.

## 2020-09-01 NOTE — ED Provider Notes (Signed)
[facility] Provider Note   CSN: [id number] Arrival date & time: 09/01/20  1546     History Chief Complaint  Patient presents with  . Weakness    Joann Mckay is a 65 y.o. female.  HPI   Patient with significant medical history of hypertension presents with chief complaint of abnormal labs.  Patient endorses that she was seen at her PCP 3 weeks ago and they noted that her sodium was low 130, she instructed to come to the emergency department for further evaluation.  She does endorse yesterday she felt slightly weak and this is since resolved, but denies headaches, change in vision, paresthesias in the upper or lower extremities, she denies having chest pain, shortness of breath, becoming diaphoretic, denies lightheaded or dizziness.  She has no cardiac history, no history of PEs or DVTs, currently not on hormone therapy.  Patient states she does not have a history of low sodium and was slightly concerned.  Patient denies headaches, fevers, chills, shortness of breath, chest pain, abdominal pain, nausea, vomiting, diarrhea, worsening pedal edema.  Past Medical History:  Diagnosis Date  . Anxiety   . Hypertension     Patient Active Problem List   Diagnosis Date Noted  . Family history of colon cancer 12/11/2018  . Hyperlipidemia 02/14/2017  . Insomnia 02/14/2017  . Cigarette nicotine dependence without complication 50/56/9794  . COLONIC POLYPS, ADENOMATOUS 08/04/2009  . Essential hypertension 08/04/2009    Past Surgical History:  Procedure Laterality Date  . COLONOSCOPY    . LEEP    . MANDIBLE FRACTURE SURGERY       OB History   No obstetric history on file.     Family History  Problem Relation Age of Onset  . Colon cancer Mother 2       passed 6 weeks after diagnosis  . Cancer Father        lung    Social History   Tobacco Use  . Smoking status: Current Every Day Smoker    Packs/day: 0.50    Years: 38.00    Pack years: 19.00  .  Smokeless tobacco: Never Used  Vaping Use  . Vaping Use: Never used  Substance Use Topics  . Alcohol use: No  . Drug use: No    Home Medications Prior to Admission medications   Medication Sig Start Date End Date Taking? Authorizing Provider  Ascorbic Acid (VITAMIN C) 1000 MG tablet Take 1,000 mg by mouth daily.    [provider]  aspirin-sod bicarb-citric acid (ALKA-SELTZER) 325 MG TBEF tablet Take 325 mg by mouth every 6 (six) hours as needed.    [provider]  carboxymethylcellulose (REFRESH PLUS) 0.5 % SOLN Place 1 drop into both eyes daily as needed (Dry Eyes).    [provider]  citalopram (CELEXA) 10 MG tablet Take 1 tablet by mouth daily. 10/29/18   [provider]  ibuprofen (ADVIL,MOTRIN) 200 MG tablet Take 200 mg by mouth daily as needed for headache.    [provider]  lisinopril (PRINIVIL,ZESTRIL) 10 MG tablet Take 1 tablet (10 mg total) by mouth daily. 03/03/15   Soyla Dryer, PA-C  Multiple Vitamins-Minerals (CENTRUM SILVER 50+WOMEN) TABS Take 1 tablet by mouth daily.    [provider]  olopatadine (PATANOL) 0.1 % ophthalmic solution 1 drop 2 (two) times daily as needed for allergies.    [provider]  polyethylene glycol-electrolytes (TRILYTE) 420 g solution Take 4,000 mLs by mouth as directed. 12/17/18  Fields, Marga Melnick, MD  traZODone (DESYREL) 50 MG tablet Take 50 mg by mouth at bedtime as needed for sleep.    [provider]    Allergies    Nsaids  Review of Systems   Review of Systems  Constitutional: Negative for chills and fever.  HENT: Negative for congestion.   Respiratory: Negative for shortness of breath.   Cardiovascular: Negative for chest pain.  Gastrointestinal: Negative for abdominal pain, diarrhea and vomiting.  Genitourinary: Negative for enuresis.  Musculoskeletal: Negative for back pain.  Skin: Negative for rash.  Neurological: Negative for dizziness and headaches.   Hematological: Does not bruise/bleed easily.    Physical Exam Updated Vital Signs BP (!) 147/79 (BP Location: Right Arm)   Pulse 60   Temp 98 F (36.7 C) (Oral)   Resp 16   Ht 5\' 6"  (1.676 m)   Wt 59.9 kg   SpO2 100%   BMI 21.31 kg/m   Physical Exam Vitals and nursing note reviewed.  Constitutional:      General: She is not in acute distress.    Appearance: She is not ill-appearing.  HENT:     Head: Normocephalic and atraumatic.     Nose: No congestion.  Eyes:     Extraocular Movements: Extraocular movements intact.     Conjunctiva/sclera: Conjunctivae normal.     Pupils: Pupils are equal, round, and reactive to light.  Cardiovascular:     Rate and Rhythm: Normal rate and regular rhythm.     Pulses: Normal pulses.     Heart sounds: No murmur heard. No friction rub. No gallop.   Pulmonary:     Effort: No respiratory distress.     Breath sounds: No wheezing, rhonchi or rales.  Abdominal:     Palpations: Abdomen is soft.     Tenderness: There is no abdominal tenderness.  Musculoskeletal:     Cervical back: Normal range of motion.     Right lower leg: No edema.     Left lower leg: No edema.  Skin:    General: Skin is warm and dry.  Neurological:     Mental Status: She is alert.     GCS: GCS eye subscore is 4. GCS verbal subscore is 5. GCS motor subscore is 6.     Cranial Nerves: No facial asymmetry.     Motor: No weakness.     Coordination: Romberg sign negative. Finger-Nose-Finger Test normal.     Comments: Cranial nerves II through XII are grossly intact  Patient no difficulty with word finding.  Psychiatric:        Mood and Affect: Mood normal.     ED Results / Procedures / Treatments   Labs (all labs ordered are listed, but only abnormal results are displayed) Labs Reviewed  BASIC METABOLIC PANEL - Abnormal; Notable for the following components:      Result Value   Sodium 132 (*)    All other components within normal limits  CBC WITH  DIFFERENTIAL/PLATELET - Abnormal; Notable for the following components:   RBC 3.52 (*)    Hemoglobin 11.5 (*)    HCT 34.3 (*)    All other components within normal limits  TROPONIN I (HIGH SENSITIVITY)  TROPONIN I (HIGH SENSITIVITY)    EKG None  Radiology No results found.  Procedures Procedures   Medications Ordered in ED Medications - No data to display  ED Course  I have reviewed the triage vital signs and the nursing notes.  Pertinent labs &  imaging results that were available during my care of the patient were reviewed by me and considered in my medical decision making (see chart for details).    MDM Rules/Calculators/A&P                         Initial impression-patient presents with concerns of abnormal lab work.  She is alert, does not appear in acute distress, vital signs reassuring.  Will obtain basic lab work, EKG, chest x-ray, troponin and reassess.  Work-up-CBC shows normocytic anemia at baseline for patient, hemoglobin is 11.5.  BMP shows slight hyponatremia 132, first troponin is 2.  EKG sinus without signs of ischemia no salvation depression noted.  Rule out-I have low suspicion for intracranial head bleed or mass as there is no neuro deficits on my exam.  Low suspicion for ACS as patient denies chest pain, shortness of breath, EKG is without signs of ischemia, initial troponin is 2 will defer second opponent as patient has been chest pain-free for greater than 12 hours.  Low suspicion for PE as patient denies pleuritic chest pain, shortness of breath, vital signs are reassuring patient is low risk factors.  Low suspicion for systemic infection as patient is nontoxic-appearing, vital signs reassuring, no obvious source infection on my exam.  Plan-patient has hyponatremia with unknown etiology, suspect this may be secondary due to her blood pressure medications.  Due to shift change patient will handed to [facility], PA-C, she was provided HPI, current  work-up, likely disposition pending normal chest x-ray patient can be discharged home follow-up with PCP for further evaluation.    Final Clinical Impression(s) / ED Diagnoses Final diagnoses:  Weakness  Hyponatremia    Rx / DC Orders ED Discharge Orders    None       Marcello Fennel, PA-C 09/01/20 1902    Hayden Rasmussen, MD 09/02/20 1213

## 2020-09-01 NOTE — ED Notes (Signed)
See PA assessment 

## 2020-09-01 NOTE — ED Notes (Signed)
Radiology staff reports pt refused cxr.

## 2020-09-01 NOTE — ED Provider Notes (Signed)
   Patient signed out to me by Deno Etienne, PA-C at end of shift pending chest x-ray results.   Patient is a 65 year old female that was seen by her PCP several weeks ago and noted to have a sodium of 130 and advised to come to the emergency department for further evaluation.  Denies any significant symptoms at this time  See previous providers note for complete work-up, she has a normocytic anemia that is at baseline her sodium is 132 today and her EKG was reassuring.  Patient notified of her chest x-ray results.  Denies any symptoms and requesting discharge home.  I feel this is appropriate and she agrees to follow-up with her PCP   DG Chest Port 1 View  Result Date: 09/01/2020 CLINICAL DATA:  Weakness.  History of hypertension. EXAM: PORTABLE CHEST 1 VIEW COMPARISON:  None. FINDINGS: Heart size and pulmonary vascularity are normal. Hyperinflation in the lungs. No airspace disease or consolidation. No pleural effusions. No pneumothorax. Mediastinal contours appear intact. IMPRESSION: Hyperinflation. No evidence of active pulmonary disease. Electronically Signed   By: Lucienne Capers M.D.   On: 09/01/2020 20:29      Kem Parkinson, PA-C 09/01/20 2048    Hayden Rasmussen, MD 09/02/20 1213

## 2020-12-08 ENCOUNTER — Other Ambulatory Visit (HOSPITAL_COMMUNITY): Payer: Self-pay | Admitting: Otolaryngology

## 2020-12-08 DIAGNOSIS — K118 Other diseases of salivary glands: Secondary | ICD-10-CM

## 2020-12-29 ENCOUNTER — Other Ambulatory Visit: Payer: Self-pay

## 2020-12-29 ENCOUNTER — Ambulatory Visit (HOSPITAL_COMMUNITY)
Admission: RE | Admit: 2020-12-29 | Discharge: 2020-12-29 | Disposition: A | Discharge status: Home / Self Care | Payer: 59 | Source: Ambulatory Visit | Attending: Otolaryngology | Admitting: Otolaryngology

## 2020-12-29 DIAGNOSIS — K118 Other diseases of salivary glands: Secondary | ICD-10-CM | POA: Insufficient documentation

## 2020-12-29 LAB — POCT I-STAT CREATININE: Creatinine, Ser: 0.8 mg/dL (ref 0.44–1.00)

## 2020-12-29 MED ORDER — IOHEXOL 300 MG/ML  SOLN
75.0000 mL | Freq: Once | INTRAMUSCULAR | Status: AC | PRN
  Administered 2020-12-29: 75 mL via INTRAVENOUS

## 2021-01-18 LAB — CBC: RBC: 3.78 — AB (ref 3.87–5.11)

## 2021-01-18 LAB — BASIC METABOLIC PANEL
BUN: 13 (ref 4–21)
Creatinine: 0.9 (ref 0.5–1.1)
Potassium: 4.9 (ref 3.4–5.3)
Sodium: 131 — AB (ref 137–147)

## 2021-01-18 LAB — CBC AND DIFFERENTIAL
HCT: 36 (ref 36–46)
Hemoglobin: 12.1 (ref 12.0–16.0)

## 2021-01-19 LAB — VITAMIN D 25 HYDROXY (VIT D DEFICIENCY, FRACTURES): Vit D, 25-Hydroxy: 55

## 2021-01-19 LAB — TSH: TSH: 1.63 (ref 0.41–5.90)

## 2021-01-19 LAB — COMPREHENSIVE METABOLIC PANEL: Calcium: 9.4 (ref 8.7–10.7)

## 2021-03-15 ENCOUNTER — Ambulatory Visit: Payer: Self-pay | Admitting: "Endocrinology

## 2021-03-24 ENCOUNTER — Other Ambulatory Visit (HOSPITAL_COMMUNITY): Payer: Self-pay | Admitting: Family Medicine

## 2021-03-24 DIAGNOSIS — Z1231 Encounter for screening mammogram for malignant neoplasm of breast: Secondary | ICD-10-CM

## 2021-03-31 ENCOUNTER — Other Ambulatory Visit: Payer: Self-pay

## 2021-03-31 ENCOUNTER — Encounter: Payer: Self-pay | Admitting: "Endocrinology

## 2021-03-31 ENCOUNTER — Ambulatory Visit (INDEPENDENT_AMBULATORY_CARE_PROVIDER_SITE_OTHER): Payer: 59 | Admitting: "Endocrinology

## 2021-03-31 VITALS — BP 132/78 | HR 72 | Ht 65.75 in | Wt 132.4 lb

## 2021-03-31 DIAGNOSIS — E871 Hypo-osmolality and hyponatremia: Secondary | ICD-10-CM

## 2021-03-31 NOTE — Progress Notes (Signed)
Endocrinology Consult Note                                            04/01/2021, 9:57 AM   Subjective:    Patient ID: Joann Mckay, female    DOB: 08-27-1955, PCP Bucio, [place] Dragon, FNP   Past Medical History:  Diagnosis Date   Anxiety    Hypertension    Past Surgical History:  Procedure Laterality Date   COLONOSCOPY     LEEP     MANDIBLE FRACTURE SURGERY     Social History   Socioeconomic History   Marital status: Single    Spouse name: Not on file   Number of children: Not on file   Years of education: Not on file   Highest education level: Not on file  Occupational History   Not on file  Tobacco Use   Smoking status: Every Day    Packs/day: 0.50    Years: 38.00    Pack years: 19.00    Types: Cigarettes   Smokeless tobacco: Never  Vaping Use   Vaping Use: Never used  Substance and Sexual Activity   Alcohol use: No   Drug use: No   Sexual activity: Not on file  Other Topics Concern   Not on file  Social History Narrative   Not on file   Social Determinants of Health   Financial Resource Strain: Not on file  Food Insecurity: Not on file  Transportation Needs: Not on file  Physical Activity: Not on file  Stress: Not on file  Social Connections: Not on file   Family History  Problem Relation Age of Onset   Colon cancer Mother 58       passed 6 weeks after diagnosis   Cancer Father        lung   Outpatient Encounter Medications as of 03/31/2021  Medication Sig   CALCIUM CITRATE PO Take 1 tablet by mouth 3 (three) times a week.   Cholecalciferol (VITAMIN D-3) 125 MCG (5000 UT) TABS Take 1 tablet by mouth daily in the afternoon.   Ascorbic Acid (VITAMIN C) 1000 MG tablet Take 1,000 mg by mouth daily.   aspirin-sod bicarb-citric acid (ALKA-SELTZER) 325 MG TBEF tablet Take 325 mg by mouth every 6 (six) hours as needed.   carboxymethylcellulose (REFRESH PLUS) 0.5 % SOLN Place 1 drop into both eyes daily as needed (Dry Eyes).   citalopram  (CELEXA) 10 MG tablet Take 1 tablet by mouth daily.   ibuprofen (ADVIL,MOTRIN) 200 MG tablet Take 200 mg by mouth daily as needed for headache.   lisinopril (PRINIVIL,ZESTRIL) 10 MG tablet Take 1 tablet (10 mg total) by mouth daily.   Multiple Vitamins-Minerals (CENTRUM SILVER 50+WOMEN) TABS Take 1 tablet by mouth daily.   olopatadine (PATANOL) 0.1 % ophthalmic solution 1 drop 2 (two) times daily as needed for allergies.   traZODone (DESYREL) 50 MG tablet Take 75 mg by mouth at bedtime as needed for sleep.   [DISCONTINUED] polyethylene glycol-electrolytes (TRILYTE) 420 g solution Take 4,000 mLs by mouth as directed.   No facility-administered encounter medications on file as of 03/31/2021.   ALLERGIES: Allergies  Allergen Reactions   Nsaids Rash    VACCINATION STATUS: Immunization History  Administered Date(s) Administered   Influenza Split 04/07/2015    HPI Joann Mckay is 65 y.o. female who  presents today with a medical history as above. she is being seen in consultation for hyponatremia requested by Bucio, [place] Dragon, FNP.  Patient is a poor historian.  History is obtained from the patient as well as her chart review.  She is referred due to hyponatremia of 131 on routine blood work on January 18, 2021.  Further review of her medical history showed similar sodium level of 132 back in April 2022.  She denies any symptoms specifically denies any history of seizures, weakness.  She admits to drinking up to 3 L of free water in addition to her other liquids including electrolytes, tea, juice, etc.  She feels dry mouth due to antidepressants.  She is a chronic heavy smoker smoked for 40+ years.  She denies any history of malignancy.  She denies any history of adrenal insufficiency nor hypothyroidism.  She has hypertension on treatment with lisinopril.  She is not on diuretics. She denies any prior history of treatment or hospitalization for hyponatremia.  Review of her other labs show  normal CMP, normal CBC, normal TSH of 1.63, and normal 25-hydroxy vitamin D of 55. 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  She still has about 2 cm lesion of the same spot, working with her surgeon for possible excision. Based on 1 documented A1c of 6.1%, she also has prediabetes, not on treatment.   Review of Systems  Constitutional: + Minimally fluctuating body weight, denies recent major change,  + fatigue, no subjective hyperthermia, no subjective hypothermia Eyes: no blurry vision, no xerophthalmia ENT: no sore throat, no nodules palpated in throat, no dysphagia/odynophagia, no hoarseness Cardiovascular: no Chest Pain, + exertional shortness of breath,  no palpitations, no leg swelling Respiratory: no cough, no shortness of breath Gastrointestinal: no Nausea/Vomiting/Diarhhea Musculoskeletal: no muscle/joint aches Skin: no rashes Neurological: no tremors, no numbness, no tingling, no dizziness Psychiatric: +depression, no anxiety  Objective:    Vitals with BMI 03/31/2021 09/01/2020 09/01/2020  Height 5' 5.75" - -  Weight 132 lbs 6 oz - -  BMI 38.18 - -  Systolic 299 371 696  Diastolic 78 86 88  Pulse 72 65 70    BP 132/78   Pulse 72   Ht 5' 5.75" (1.67 m)   Wt 132 lb 6.4 oz (60.1 kg)   BMI 21.53 kg/m   Wt Readings from Last 3 Encounters:  03/31/21 132 lb 6.4 oz (60.1 kg)  09/01/20 132 lb (59.9 kg)  12/11/18 130 lb 12.8 oz (59.3 kg)    Physical Exam  Constitutional:  Body mass index is 21.53 kg/m.,  not in acute distress, normal state of mind Eyes: PERRLA, EOMI, no exophthalmos ENT: moist mucous membranes, no gross thyromegaly, no gross cervical lymphadenopathy Cardiovascular: normal precordial activity, Regular Rate and Rhythm, no Murmur/Rubs/Gallops Respiratory:  adequate breathing efforts, no gross chest deformity, + scattered wheezes on bilateral lung fields.  Gastrointestinal: abdomen soft, Non -tender, No distension,  Bowel Sounds present, no gross organomegaly Musculoskeletal: no gross deformities, strength intact in all four extremities Skin: moist, warm, no rashes Neurological: no tremor with outstretched hands, Deep tendon reflexes normal in bilateral lower extremities.  CMP ( most recent) CMP     Component Value Date/Time   NA 131 (A) 01/18/2021 0000   K 4.9 01/18/2021 0000   CL 99 09/01/2020 1750   CO2 24 09/01/2020 1750   GLUCOSE 99 09/01/2020 1750   BUN 13 01/18/2021 0000   CREATININE 0.9 01/18/2021 0000  CREATININE 0.80 12/29/2020 1731   CALCIUM 9.4 01/18/2021 0000   GFRNONAA >60 09/01/2020 1750     Diabetic Labs (most recent): Lab Results  Component Value Date   HGBA1C 6.1 (H) 03/09/2015     Lipid Panel ( most recent) Lipid Panel     Component Value Date/Time   CHOL 191 03/09/2015 0703   TRIG 63 03/09/2015 0703   HDL 70 03/09/2015 0703   CHOLHDL 2.7 03/09/2015 0703   VLDL 13 03/09/2015 0703   LDLCALC 108 03/09/2015 0703      Lab Results  Component Value Date   TSH 1.63 01/18/2021      Assessment & Plan:   1. Hyponatremia  - KADEE PHILYAW  is being seen at a kind request of Bucio, Bow[place], FNP. - I have reviewed her available  records and clinically evaluated the patient.  I discussed her previous and recent labs results with her. - Based on these reviews, she has subacute to chronic mild hyponatremia.  Patient is asymptomatic. 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 SIADH is also possibility.  For now, this will be approached as SIADH and treatment will be water striction. Patients with subacute to chronic hyponatremia need not be corrected to " normal" sodium level.  This is due to the fact that they have lower hypothalamic Osmostat, hence, they are asymptomatic at the lower sodium level between 130-135. She will is advised to cut her free water intake by half to less than 1 L a day, allowed her to drink more electrolyte  liquids. She will return in 5 weeks with repeat CMP, serum osmolality, urine osmolality, a.m. cortisol and uric acid. -Her heavy smoking history puts her at risk of occult malignancy.  She is extensively counseled against smoking.  The patient was counseled on the dangers of tobacco use, and was advised to quit.  Reviewed strategies to maximize success, including removing cigarettes and smoking materials from environment.  She is encouraged to continue follow-up and finish the treatment plan for right parotid gland  neoplasm.  She will have repeat point-of-care A1c during her next visit.  - I did not initiate any new prescriptions today. - she is advised to maintain close follow up with Bucio, [place] Dragon, FNP for primary care needs.   - Time spent with the patient: 60 minutes, of which >50% was spent in  counseling her about her hyponatremia and the rest in obtaining information about her symptoms, reviewing her previous labs/studies ( including abstractions from other facilities),  evaluations, and treatments,  and developing a plan to confirm diagnosis and long term treatment based on the latest standards of care/guidelines; and documenting her care.  Joann Mckay participated in the discussions, expressed understanding, and voiced agreement with the above plans.  All questions were answered to her satisfaction. she is encouraged to contact clinic should she have any questions or concerns prior to her return visit.  Follow up plan: Return in about 5 weeks (around 05/05/2021) for F/U with Pre-visit Labs.   Glade Lloyd, MD [facility] [address] [place], [place] [address] Phone: [phone]  Fax: [phone]     04/01/2021, 9:57 AM  This note was partially dictated with voice recognition software. Similar sounding words can be transcribed inadequately or may not  be corrected upon review.

## 2021-04-06 ENCOUNTER — Other Ambulatory Visit: Payer: Self-pay

## 2021-04-06 ENCOUNTER — Ambulatory Visit (HOSPITAL_COMMUNITY)
Admission: RE | Admit: 2021-04-06 | Discharge: 2021-04-06 | Disposition: A | Discharge status: Home / Self Care | Payer: 59 | Source: Ambulatory Visit | Attending: Family Medicine | Admitting: Family Medicine

## 2021-04-06 ENCOUNTER — Encounter (HOSPITAL_COMMUNITY): Payer: Self-pay

## 2021-04-06 DIAGNOSIS — Z1231 Encounter for screening mammogram for malignant neoplasm of breast: Secondary | ICD-10-CM | POA: Insufficient documentation

## 2021-04-07 ENCOUNTER — Other Ambulatory Visit (HOSPITAL_COMMUNITY): Payer: Self-pay | Admitting: Family Medicine

## 2021-04-13 ENCOUNTER — Other Ambulatory Visit (HOSPITAL_COMMUNITY): Payer: Self-pay | Admitting: Family Medicine

## 2021-04-13 DIAGNOSIS — R928 Other abnormal and inconclusive findings on diagnostic imaging of breast: Secondary | ICD-10-CM

## 2021-04-27 ENCOUNTER — Other Ambulatory Visit: Payer: Self-pay

## 2021-04-27 ENCOUNTER — Ambulatory Visit (HOSPITAL_COMMUNITY)
Admission: RE | Admit: 2021-04-27 | Discharge: 2021-04-27 | Disposition: A | Discharge status: Home / Self Care | Payer: 59 | Source: Ambulatory Visit | Attending: Family Medicine | Admitting: Family Medicine

## 2021-04-27 DIAGNOSIS — R928 Other abnormal and inconclusive findings on diagnostic imaging of breast: Secondary | ICD-10-CM | POA: Insufficient documentation

## 2021-05-09 LAB — OSMOLALITY, URINE: Osmolality, Ur: 256 mOsmol/kg

## 2021-05-09 LAB — COMPREHENSIVE METABOLIC PANEL
ALT: 18 IU/L (ref 0–32)
AST: 23 IU/L (ref 0–40)
Albumin/Globulin Ratio: 2.4 — ABNORMAL HIGH (ref 1.2–2.2)
Albumin: 4.8 g/dL (ref 3.8–4.8)
Alkaline Phosphatase: 50 IU/L (ref 44–121)
BUN/Creatinine Ratio: 15 (ref 12–28)
BUN: 12 mg/dL (ref 8–27)
Bilirubin Total: 0.4 mg/dL (ref 0.0–1.2)
CO2: 23 mmol/L (ref 20–29)
Calcium: 9.4 mg/dL (ref 8.7–10.3)
Chloride: 97 mmol/L (ref 96–106)
Creatinine, Ser: 0.8 mg/dL (ref 0.57–1.00)
Globulin, Total: 2 g/dL (ref 1.5–4.5)
Glucose: 89 mg/dL (ref 70–99)
Potassium: 4.4 mmol/L (ref 3.5–5.2)
Sodium: 131 mmol/L — ABNORMAL LOW (ref 134–144)
Total Protein: 6.8 g/dL (ref 6.0–8.5)
eGFR: 82 mL/min/{1.73_m2} (ref >59)

## 2021-05-09 LAB — CORTISOL-AM, BLOOD: Cortisol - AM: 16.8 ug/dL (ref 6.2–19.4)

## 2021-05-09 LAB — URIC ACID: Uric Acid: 4.9 mg/dL (ref 3.0–7.2)

## 2021-05-09 LAB — OSMOLALITY: Osmolality Meas: 276 mOsmol/kg — ABNORMAL LOW (ref 280–301)

## 2021-05-11 ENCOUNTER — Ambulatory Visit (INDEPENDENT_AMBULATORY_CARE_PROVIDER_SITE_OTHER): Payer: 59 | Admitting: "Endocrinology

## 2021-05-11 ENCOUNTER — Encounter: Payer: Self-pay | Admitting: "Endocrinology

## 2021-05-11 ENCOUNTER — Other Ambulatory Visit: Payer: Self-pay

## 2021-05-11 VITALS — BP 148/82 | HR 72 | Ht 65.75 in | Wt 134.8 lb

## 2021-05-11 DIAGNOSIS — R7303 Prediabetes: Secondary | ICD-10-CM | POA: Diagnosis not present

## 2021-05-11 DIAGNOSIS — I10 Essential (primary) hypertension: Secondary | ICD-10-CM | POA: Diagnosis not present

## 2021-05-11 DIAGNOSIS — E871 Hypo-osmolality and hyponatremia: Secondary | ICD-10-CM | POA: Diagnosis not present

## 2021-05-11 DIAGNOSIS — F17200 Nicotine dependence, unspecified, uncomplicated: Secondary | ICD-10-CM | POA: Diagnosis not present

## 2021-05-11 LAB — POCT GLYCOSYLATED HEMOGLOBIN (HGB A1C): HbA1c, POC (controlled diabetic range): 5.7 % (ref 0.0–7.0)

## 2021-05-11 NOTE — Addendum Note (Signed)
Addended by: Ellin Saba on: 05/11/2021 02:57 PM   Modules accepted: Orders

## 2021-05-11 NOTE — Progress Notes (Signed)
05/11/2021, 1:37 PM  Endocrinology follow-up note   Subjective:    Patient ID: Joann Mckay, female    DOB: 05/01/1956, PCP Bucio, [place] Dragon, FNP   Past Medical History:  Diagnosis Date   Anxiety    Hypertension    Past Surgical History:  Procedure Laterality Date   COLONOSCOPY     LEEP     MANDIBLE FRACTURE SURGERY     Social History   Socioeconomic History   Marital status: Single    Spouse name: Not on file   Number of children: Not on file   Years of education: Not on file   Highest education level: Not on file  Occupational History   Not on file  Tobacco Use   Smoking status: Every Day    Packs/day: 0.50    Years: 38.00    Pack years: 19.00    Types: Cigarettes   Smokeless tobacco: Never  Vaping Use   Vaping Use: Never used  Substance and Sexual Activity   Alcohol use: No   Drug use: No   Sexual activity: Not on file  Other Topics Concern   Not on file  Social History Narrative   Not on file   Social Determinants of Health   Financial Resource Strain: Not on file  Food Insecurity: Not on file  Transportation Needs: Not on file  Physical Activity: Not on file  Stress: Not on file  Social Connections: Not on file   Family History  Problem Relation Age of Onset   Colon cancer Mother 21       passed 6 weeks after diagnosis   Cancer Father        lung   Outpatient Encounter Medications as of 05/11/2021  Medication Sig   Ascorbic Acid (VITAMIN C) 1000 MG tablet Take 1,000 mg by mouth daily.   aspirin-sod bicarb-citric acid (ALKA-SELTZER) 325 MG TBEF tablet Take 325 mg by mouth every 6 (six) hours as needed.   CALCIUM CITRATE PO Take 1 tablet by mouth 3 (three) times a week.   carboxymethylcellulose (REFRESH PLUS) 0.5 % SOLN Place 1 drop into both eyes daily as needed (Dry Eyes).   Cholecalciferol (VITAMIN D-3) 125 MCG (5000 UT) TABS Take 1 tablet by mouth daily in the afternoon.    citalopram (CELEXA) 10 MG tablet Take 1 tablet by mouth daily.   ibuprofen (ADVIL,MOTRIN) 200 MG tablet Take 200 mg by mouth daily as needed for headache.   lisinopril (PRINIVIL,ZESTRIL) 10 MG tablet Take 1 tablet (10 mg total) by mouth daily.   Multiple Vitamins-Minerals (CENTRUM SILVER 50+WOMEN) TABS Take 1 tablet by mouth daily.   olopatadine (PATANOL) 0.1 % ophthalmic solution 1 drop 2 (two) times daily as needed for allergies.   traZODone (DESYREL) 50 MG tablet Take 75 mg by mouth at bedtime as needed for sleep.   No facility-administered encounter medications on file as of 05/11/2021.   ALLERGIES: Allergies  Allergen Reactions   Nsaids Rash    VACCINATION STATUS: Immunization History  Administered Date(s) Administered   Influenza Split 04/07/2015    HPI Joann Mckay is 65 y.o. female who presents today with a medical history as above. she is being seen in follow-up  after she was seen in consultation for hyponatremia requested by Bucio, Lafayette Dragon, FNP.  Patient is a poor historian.  History is obtained from the patient as well as her chart review.  She is referred due to hyponatremia of 131 on routine blood work on January 18, 2021.  Further review of her medical history showed similar sodium level of 132 back in April 2022.  She denies any symptoms specifically denies any history of seizures, weakness.  States she was advised to lower her free water consumption last visit, she drinks 1 L of free water a day and obtains the rest of her fluid from other liquids such as Gatorade, tea, juice.  She has no new complaints today.  Her previsit labs confirm same mild hyponatremia at 131.  She has a lower serum osmolality of 276, urine osmolality of 256, serum uric acid at 4.9, normal cortisol.  She feels dry mouth due to antidepressants.  She is a chronic heavy smoker smoked for 40+ years.  She denies any history of malignancy.  She denies any history of adrenal insufficiency nor  hypothyroidism.  She has hypertension on treatment with lisinopril.  She is not on diuretics. She denies any prior history of treatment or hospitalization for hyponatremia.  Review of her other labs show normal CMP, normal CBC, normal TSH of 1.63, and normal 25-hydroxy vitamin D of 55. 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  She still has about 2 cm lesion of the same spot, working with her surgeon for possible excision. Her point-of-care A1c today is 5.7% improving from 6.1% consistent with prediabetes.  She is not on treatment.     Review of Systems  Constitutional: + Minimally fluctuating body weight, denies recent major change,  + fatigue, no subjective hyperthermia, no subjective hypothermia Eyes: no blurry vision, no xerophthalmia   Objective:    Vitals with BMI 05/11/2021 03/31/2021 09/01/2020  Height 5' 5.75" 5' 5.75" -  Weight 134 lbs 13 oz 132 lbs 6 oz -  BMI 16.10 96.04 -  Systolic 540 981 191  Diastolic 82 78 86  Pulse 72 72 65    BP (!) 148/82    Pulse 72    Ht 5' 5.75" (1.67 m)    Wt 134 lb 12.8 oz (61.1 kg)    BMI 21.92 kg/m   Wt Readings from Last 3 Encounters:  05/11/21 134 lb 12.8 oz (61.1 kg)  03/31/21 132 lb 6.4 oz (60.1 kg)  09/01/20 132 lb (59.9 kg)    Physical Exam  Constitutional:  Body mass index is 21.92 kg/m.,  not in acute distress, normal state of mind Eyes: PERRLA, EOMI, no exophthalmos   CMP ( most recent) CMP     Component Value Date/Time   NA 131 (L) 05/06/2021 0853   K 4.4 05/06/2021 0853   CL 97 05/06/2021 0853   CO2 23 05/06/2021 0853   GLUCOSE 89 05/06/2021 0853   GLUCOSE 99 09/01/2020 1750   BUN 12 05/06/2021 0853   CREATININE 0.80 05/06/2021 0853   CALCIUM 9.4 05/06/2021 0853   PROT 6.8 05/06/2021 0853   ALBUMIN 4.8 05/06/2021 0853   AST 23 05/06/2021 0853   ALT 18 05/06/2021 0853   ALKPHOS 50 05/06/2021 0853   BILITOT 0.4 05/06/2021 0853   GFRNONAA >60 09/01/2020 1750      Diabetic Labs (most recent): Lab Results  Component Value Date   HGBA1C 6.1 (H) 03/09/2015     Lipid Panel (  most recent) Lipid Panel     Component Value Date/Time   CHOL 191 03/09/2015 0703   TRIG 63 03/09/2015 0703   HDL 70 03/09/2015 0703   CHOLHDL 2.7 03/09/2015 0703   VLDL 13 03/09/2015 0703   LDLCALC 108 03/09/2015 0703      Lab Results  Component Value Date   TSH 1.63 01/18/2021      Assessment & Plan:   1. Hyponatremia  I discussed her new labs findings with her.  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  She does not have adrenal insufficiency.  She has prediabetes with 5.7% A1c.  She  remains chronic heavy smoker. Patients is euvolemic with subacute to chronic hyponatremia , she will not  need correction of sodium  to " normal" sodium level.  This is due to the fact that this patient's have lower hypothalamic Osmostat, hence, they are asymptomatic at the lower sodium level between 130-135. She isl is advised to cut her free water intake by half to less than 1 L a day, allowed her to drink more electrolyte liquids. She is extensively counseled against smoking.  Her previsit A1c is 5.7% still consistent with prediabetes, does not need any treatment.  [employer], plant-based nutrition is recommended for her. Her blood pressure is not controlled today at 148/82, patient explains she is anxious today, despite the fact that she took her lisinopril 10 mg p.o. this morning.  During her last visit blood pressure was on target.  She is encouraged to continue follow-up and finish the treatment plan for right parotid gland  neoplasm.  She will have repeat CMP and A1c during her next visit in 6 months.   - I did not initiate any new prescriptions today. - she is advised to maintain close follow up with Bucio, [place] Dragon, FNP for primary care needs.   I spent 21 minutes in the care of the patient today including review of labs from Thyroid Function, CMP, and other relevant labs ;  imaging/biopsy records (current and previous including abstractions from other facilities); face-to-face time discussing  her lab results and symptoms, medications doses, her options of short and long term treatment based on the latest standards of care / guidelines;   and documenting the encounter.  Joann Mckay  participated in the discussions, expressed understanding, and voiced agreement with the above plans.  All questions were answered to her satisfaction. she is encouraged to contact clinic should she have any questions or concerns prior to her return visit.   Follow up plan: Return in about 6 months (around 11/09/2021) for F/U with Pre-visit Labs, A1c -NV.   Glade Lloyd, MD [facility] [address] [place], [place] [address] Phone: [phone]  Fax: [phone]     05/11/2021, 1:37 PM  This note was partially dictated with voice recognition software. Similar sounding words can be transcribed inadequately or may not  be corrected upon review.

## 2021-11-09 ENCOUNTER — Ambulatory Visit: Payer: 59 | Admitting: "Endocrinology

## 2021-12-27 HISTORY — PX: PAROTIDECTOMY: SUR1003

## 2022-04-12 ENCOUNTER — Other Ambulatory Visit (HOSPITAL_COMMUNITY): Payer: Self-pay | Admitting: Family Medicine

## 2022-04-12 DIAGNOSIS — Z1231 Encounter for screening mammogram for malignant neoplasm of breast: Secondary | ICD-10-CM

## 2022-04-17 IMAGING — DX DG CHEST 1V PORT
1 series · 1 of 1 positions shown · non-contrast
Comparison: None.

CLINICAL DATA: Weakness.  History of hypertension.

EXAM:
PORTABLE CHEST 1 VIEW

[chest ap]
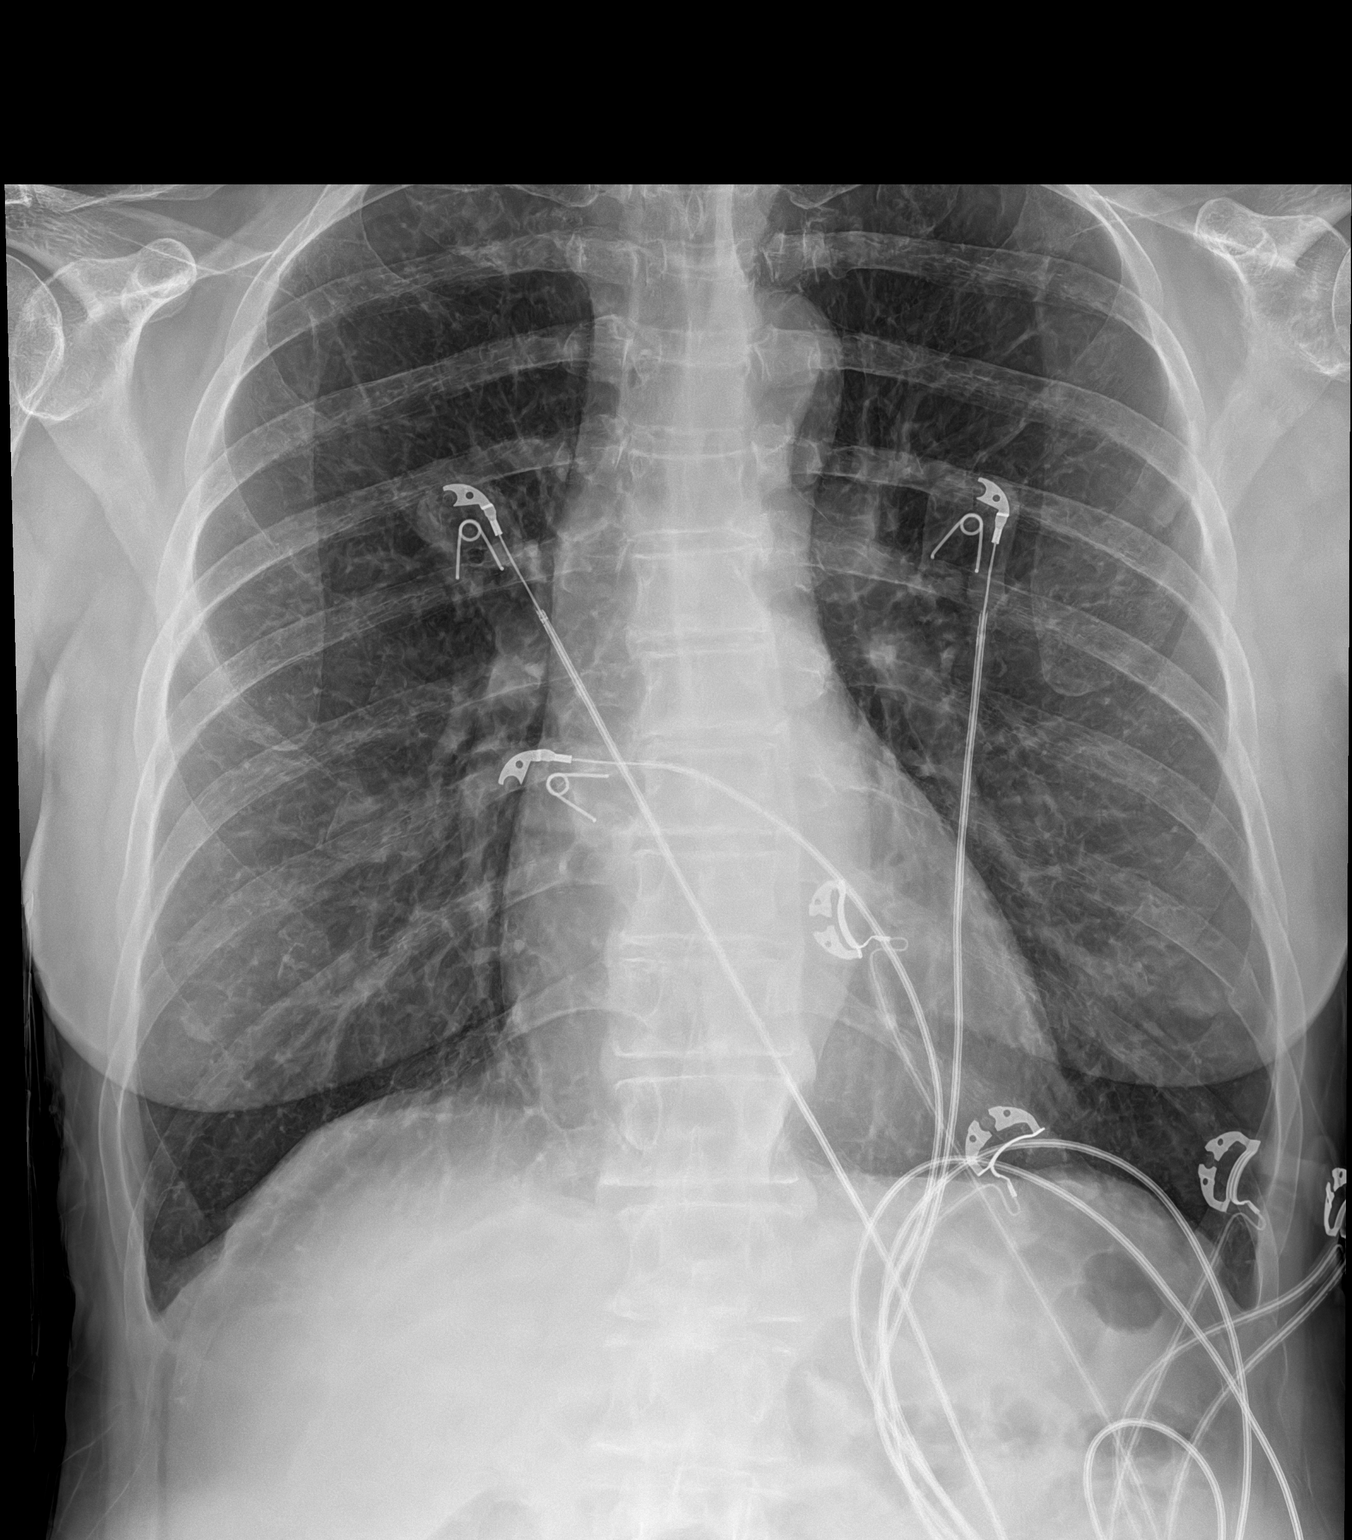

[1 of 1 positions shown; findings below may reference images not displayed]

FINDINGS: Heart size and pulmonary vascularity are normal. Hyperinflation in
the lungs. No airspace disease or consolidation. No pleural
effusions. No pneumothorax. Mediastinal contours appear intact.
IMPRESSION: Hyperinflation. No evidence of active pulmonary disease.

## 2022-04-28 ENCOUNTER — Ambulatory Visit (HOSPITAL_COMMUNITY)
Admission: RE | Admit: 2022-04-28 | Discharge: 2022-04-28 | Disposition: A | Discharge status: Home / Self Care | Payer: Medicare Other | Source: Ambulatory Visit | Attending: Family Medicine | Admitting: Family Medicine

## 2022-04-28 DIAGNOSIS — Z1231 Encounter for screening mammogram for malignant neoplasm of breast: Secondary | ICD-10-CM | POA: Insufficient documentation

## 2022-12-14 ENCOUNTER — Ambulatory Visit (HOSPITAL_COMMUNITY)
Admission: RE | Admit: 2022-12-14 | Discharge: 2022-12-14 | Disposition: A | Discharge status: Home / Self Care | Payer: Medicare Other | Source: Ambulatory Visit | Attending: Family Medicine | Admitting: Family Medicine

## 2022-12-14 ENCOUNTER — Other Ambulatory Visit (HOSPITAL_COMMUNITY): Payer: Self-pay | Admitting: Family Medicine

## 2022-12-14 DIAGNOSIS — M545 Low back pain, unspecified: Secondary | ICD-10-CM | POA: Diagnosis not present

## 2022-12-29 ENCOUNTER — Other Ambulatory Visit (HOSPITAL_COMMUNITY): Payer: Self-pay | Admitting: Family Medicine

## 2022-12-29 DIAGNOSIS — S22000S Wedge compression fracture of unspecified thoracic vertebra, sequela: Secondary | ICD-10-CM

## 2023-01-09 ENCOUNTER — Ambulatory Visit (HOSPITAL_COMMUNITY): Payer: Medicare Other

## 2023-01-09 ENCOUNTER — Encounter (HOSPITAL_COMMUNITY): Payer: Self-pay

## 2023-01-25 ENCOUNTER — Ambulatory Visit (HOSPITAL_COMMUNITY)
Admission: RE | Admit: 2023-01-25 | Discharge: 2023-01-25 | Disposition: A | Discharge status: Home / Self Care | Payer: Medicare Other | Source: Ambulatory Visit | Attending: Family Medicine | Admitting: Family Medicine

## 2023-01-25 DIAGNOSIS — S22000S Wedge compression fracture of unspecified thoracic vertebra, sequela: Secondary | ICD-10-CM | POA: Insufficient documentation

## 2023-04-06 ENCOUNTER — Other Ambulatory Visit (HOSPITAL_COMMUNITY): Payer: Self-pay | Admitting: Family Medicine

## 2023-04-06 DIAGNOSIS — Z1231 Encounter for screening mammogram for malignant neoplasm of breast: Secondary | ICD-10-CM

## 2023-05-03 ENCOUNTER — Ambulatory Visit (HOSPITAL_COMMUNITY): Payer: Medicare Other

## 2023-05-09 ENCOUNTER — Ambulatory Visit (HOSPITAL_COMMUNITY)
Admission: RE | Admit: 2023-05-09 | Discharge: 2023-05-09 | Disposition: A | Discharge status: Home / Self Care | Payer: Medicare Other | Source: Ambulatory Visit | Attending: Family Medicine | Admitting: Family Medicine

## 2023-05-09 DIAGNOSIS — Z1231 Encounter for screening mammogram for malignant neoplasm of breast: Secondary | ICD-10-CM | POA: Insufficient documentation

## 2023-07-24 ENCOUNTER — Other Ambulatory Visit (HOSPITAL_COMMUNITY): Payer: Self-pay | Admitting: Family Medicine

## 2023-07-24 DIAGNOSIS — R140 Abdominal distension (gaseous): Secondary | ICD-10-CM

## 2023-07-24 DIAGNOSIS — R109 Unspecified abdominal pain: Secondary | ICD-10-CM

## 2023-07-26 ENCOUNTER — Ambulatory Visit (HOSPITAL_COMMUNITY)
Admission: RE | Admit: 2023-07-26 | Discharge: 2023-07-26 | Disposition: A | Discharge status: Home / Self Care | Payer: Medicare Other | Source: Ambulatory Visit | Attending: Family Medicine | Admitting: Family Medicine

## 2023-07-26 DIAGNOSIS — R140 Abdominal distension (gaseous): Secondary | ICD-10-CM | POA: Diagnosis present

## 2023-07-26 DIAGNOSIS — R109 Unspecified abdominal pain: Secondary | ICD-10-CM | POA: Diagnosis present

## 2023-07-28 LAB — LAB REPORT - SCANNED: EGFR: 77

## 2023-08-08 ENCOUNTER — Encounter: Payer: Self-pay | Admitting: *Deleted

## 2023-08-14 ENCOUNTER — Encounter: Payer: Self-pay | Admitting: Gastroenterology

## 2023-08-14 NOTE — Progress Notes (Unsigned)
 Referring Provider: Shelby Dubin, FNP Primary Care Physician:  Shelby Dubin, FNP Primary Gastroenterologist:  Dr. Darrick Penna previously. Will establish with Dr. Marletta Lor  No chief complaint on file.   HPI:   MARICELLA FILYAW is a 68 y.o. female presenting today at the request of Bucio, Elsa C, FNP for abdominal discomfort.   Reviewed referral information.  Office visit 07/19/2023 with patient reporting ongoing GI symptoms for several months.  Felt bloated, had abdominal discomfort.  Previously had several months of diarrhea, but this resolved.  Reports she was gaining weight and felt that it was all in her abdomen.  Plan to update labs, obtain ultrasound, and refer to GI.  Labs completed 07/24/2023: Looks like H. pylori breath test was ordered, but I do not see a result.  Iron panel with low ferritin at 12***.  Amylase and lipase within normal limits.  Hemoglobin 11.8.  LFTs, creatinine, electrolytes within normal limits.  Abdominal ultrasound 07/26/2023 with mildly distended gallbladder, no shadowing stones, CBD 6 mm, normal liver.    Today:      Patient has history of adenomatous colon polyp removed in 2011.  Overdue for surveillance.  Was due in 2016. ***    Past Medical History:  Diagnosis Date   Anxiety    Hypertension     Past Surgical History:  Procedure Laterality Date   COLONOSCOPY     LEEP     MANDIBLE FRACTURE SURGERY      Current Outpatient Medications  Medication Sig Dispense Refill   Ascorbic Acid (VITAMIN C) 1000 MG tablet Take 1,000 mg by mouth daily.     aspirin-sod bicarb-citric acid (ALKA-SELTZER) 325 MG TBEF tablet Take 325 mg by mouth every 6 (six) hours as needed.     CALCIUM CITRATE PO Take 1 tablet by mouth 3 (three) times a week.     carboxymethylcellulose (REFRESH PLUS) 0.5 % SOLN Place 1 drop into both eyes daily as needed (Dry Eyes).     Cholecalciferol (VITAMIN D-3) 125 MCG (5000 UT) TABS Take 1 tablet by mouth daily in the afternoon.      citalopram (CELEXA) 10 MG tablet Take 1 tablet by mouth daily.     ibuprofen (ADVIL,MOTRIN) 200 MG tablet Take 200 mg by mouth daily as needed for headache.     lisinopril (PRINIVIL,ZESTRIL) 10 MG tablet Take 1 tablet (10 mg total) by mouth daily. 30 tablet 1   Multiple Vitamins-Minerals (CENTRUM SILVER 50+WOMEN) TABS Take 1 tablet by mouth daily.     olopatadine (PATANOL) 0.1 % ophthalmic solution 1 drop 2 (two) times daily as needed for allergies.     traZODone (DESYREL) 50 MG tablet Take 75 mg by mouth at bedtime as needed for sleep.     No current facility-administered medications for this visit.    Allergies as of 08/16/2023 - Review Complete 05/11/2021  Allergen Reaction Noted   Nsaids Rash 03/03/2015    Family History  Problem Relation Age of Onset   Colon cancer Mother 37       passed 6 weeks after diagnosis   Cancer Father        lung    Social History   Socioeconomic History   Marital status: Single    Spouse name: Not on file   Number of children: Not on file   Years of education: Not on file   Highest education level: Not on file  Occupational History   Not on file  Tobacco Use   Smoking  status: Every Day    Current packs/day: 0.50    Average packs/day: 0.5 packs/day for 38.0 years (19.0 ttl pk-yrs)    Types: Cigarettes   Smokeless tobacco: Never  Vaping Use   Vaping status: Never Used  Substance and Sexual Activity   Alcohol use: No   Drug use: No   Sexual activity: Not on file  Other Topics Concern   Not on file  Social History Narrative   Not on file   Social Drivers of Health   Financial Resource Strain: Not on file  Food Insecurity: Not on file  Transportation Needs: Not on file  Physical Activity: Not on file  Stress: Not on file  Social Connections: Not on file  Intimate Partner Violence: Not on file    Review of Systems: Gen: Denies any fever, chills, fatigue, weight loss, lack of appetite.  CV: Denies chest pain, heart palpitations,  peripheral edema, syncope.  Resp: Denies shortness of breath at rest or with exertion. Denies wheezing or cough.  GI: Denies dysphagia or odynophagia. Denies jaundice, hematemesis, fecal incontinence. GU : Denies urinary burning, urinary frequency, urinary hesitancy MS: Denies joint pain, muscle weakness, cramps, or limitation of movement.  Derm: Denies rash, itching, dry skin Psych: Denies depression, anxiety, memory loss, and confusion Heme: Denies bruising, bleeding, and enlarged lymph nodes.  Physical Exam: There were no vitals taken for this visit. General:   Alert and oriented. Pleasant and cooperative. Well-nourished and well-developed.  Head:  Normocephalic and atraumatic. Eyes:  Without icterus, sclera clear and conjunctiva pink.  Ears:  Normal auditory acuity. Lungs:  Clear to auscultation bilaterally. No wheezes, rales, or rhonchi. No distress.  Heart:  S1, S2 present without murmurs appreciated.  Abdomen:  +BS, soft, non-tender and non-distended. No HSM noted. No guarding or rebound. No masses appreciated.  Rectal:  Deferred  Msk:  Symmetrical without gross deformities. Normal posture. Extremities:  Without edema. Neurologic:  Alert and  oriented x4;  grossly normal neurologically. Skin:  Intact without significant lesions or rashes. Psych:  Alert and cooperative. Normal mood and affect.    Assessment:     Plan:  ***   Ermalinda Memos, PA-C [facility] 08/16/2023

## 2023-08-16 ENCOUNTER — Ambulatory Visit: Admitting: Gastroenterology

## 2023-08-16 ENCOUNTER — Encounter: Payer: Self-pay | Admitting: Gastroenterology

## 2023-08-16 ENCOUNTER — Other Ambulatory Visit: Payer: Self-pay | Admitting: *Deleted

## 2023-08-16 VITALS — BP 118/80 | HR 72 | Temp 97.7°F | Ht 67.0 in | Wt 153.0 lb

## 2023-08-16 DIAGNOSIS — K828 Other specified diseases of gallbladder: Secondary | ICD-10-CM

## 2023-08-16 DIAGNOSIS — Z860101 Personal history of adenomatous and serrated colon polyps: Secondary | ICD-10-CM

## 2023-08-16 DIAGNOSIS — R140 Abdominal distension (gaseous): Secondary | ICD-10-CM

## 2023-08-16 DIAGNOSIS — Z860100 Personal history of colon polyps, unspecified: Secondary | ICD-10-CM

## 2023-08-16 NOTE — Patient Instructions (Signed)
 Please complete stool test at  [facility].   We will refer you to [facility] for SIBO testing.   We will request your colonoscopy records.   You can complete a MiraLAX prep to flush your system.  MIRALAX PREP INSTRUCTION: Purchase:  MIRALAX 238 gram bottle, 1 box of DULCOLAX (All over the counter medications)  Day of MiraLAX pre: CLEAR LIQUIDS ALL DAY--NO SOLID FOODS!  At 10:00 AM, take 2 DULCOLAX 5mg  tablets  At 12:00 PM, Mix 5 teaspoons of Miralax in any 4-6 ounces of CLEAR LIQUIDS (Gatorade) every hour for 5 hours until passing clear, watery stools. Be sure to drink 4 ounces of clear liquid 30 minutes after each dose of Miralax.   At 3:00 PM, take 2 Dulcolax 5mg  tablets  If stools are not clear & watery by 6:00 PM, take 5 teaspoons of Miralax every 30 minutes until stools are clear (no color)    CLEAR LIQUIDS: (NO RED) Jello Apple Juice  White Grape Juice Water Banana popsicles  Kool-Aid  Coffee(No cream or milk) Tea (No cream or milk) Soft drinks Broth (fat free beef/chicken/vegetable)  Clear liquids allow you to see your fingers on the other side of the glass.  Be sure they are NOT RED in color, cloudy, but CLEAR.    Ermalinda Memos, PA-C [facility]

## 2023-08-17 ENCOUNTER — Encounter: Payer: Self-pay | Admitting: Gastroenterology

## 2023-08-21 ENCOUNTER — Ambulatory Visit: Admitting: Gastroenterology

## 2023-08-21 ENCOUNTER — Encounter: Payer: Self-pay | Admitting: Gastroenterology

## 2023-08-21 ENCOUNTER — Telehealth: Payer: Self-pay | Admitting: Gastroenterology

## 2023-08-21 NOTE — Telephone Encounter (Signed)
 Received and reviewed colonoscopy report dated 04/01/2019. Patient with diverticulosis, 4 mm polyp resected and retrieced from retum, non-bleeding internal hemorrhoids. Pathology with hyperplastic polyp.   Based on this colonoscopy, her next colonoscopy should be in 2030.   Joann Mckay: Please place on recall for screening colonoscopy in November 2030.

## 2023-08-24 LAB — PANCREATIC ELASTASE, FECAL: Pancreatic Elastase-1, Stool: 197 ug/g — ABNORMAL LOW (ref >200)

## 2023-08-26 ENCOUNTER — Other Ambulatory Visit: Payer: Self-pay | Admitting: Gastroenterology

## 2023-08-26 DIAGNOSIS — K8689 Other specified diseases of pancreas: Secondary | ICD-10-CM

## 2023-08-26 MED ORDER — PANCRELIPASE (LIP-PROT-AMYL) 36000-114000 UNITS PO CPEP: ORAL_CAPSULE | ORAL | 3 refills | Status: AC

## 2023-08-30 ENCOUNTER — Telehealth: Payer: Self-pay | Admitting: *Deleted

## 2023-08-30 NOTE — Telephone Encounter (Signed)
 Pt called and states she went to pharmacy to pick up Creon and did not understand why she was taking medications. I asked her did she not remember speaking to me last week, when I explained the reasoning why she needed to take Creon.  She stated yes, but wanted the pharmacist to explain  and  they  didn't say anything about the medication. She states she Google Creon and did not like the side effects and wanted to speak to provider. I explained she was out today and would send her a message. She asked for her lab results to me mailed to her and information on pancreatic insufficiency. I printed them both and mailed them to her.

## 2023-08-31 NOTE — Telephone Encounter (Signed)
 Spoke with patient. Discussed EPI and creon. All questions answered and patient plans to start Creon today.

## 2023-09-20 ENCOUNTER — Encounter: Payer: Self-pay | Admitting: *Deleted

## 2023-10-24 ENCOUNTER — Telehealth: Payer: Self-pay | Admitting: Gastroenterology

## 2023-10-25 NOTE — Telephone Encounter (Signed)
 If she is still having issues with bloating, I would recommend proceeding with SIBO testing.

## 2023-10-25 NOTE — Telephone Encounter (Signed)
 Fowarding to [facility] to see if still needed.

## 2023-10-25 NOTE — Telephone Encounter (Signed)
 Joann Mckay came in office said you referred her to [facility] for a SIBO test they are telling her she is not in network and has to pay 400 dollars for this visit. She now wants to go to [facility]  she gave me office # [phone] and fax- [phone].

## 2023-10-26 NOTE — Telephone Encounter (Signed)
 Referral placed.

## 2023-11-27 HISTORY — PX: COLONOSCOPY: SHX174

## 2023-11-27 HISTORY — PX: UPPER GI ENDOSCOPY: SHX6162

## 2023-12-04 LAB — HM COLONOSCOPY

## 2024-01-08 NOTE — Progress Notes (Signed)
 Office Visit Note  Patient: Joann Mckay             Date of Birth: 1956-04-27           MRN: [id number]             PCP: Alston Silvio BROCKS, FNP Referring: Bucio, Elsa C, FNP Visit Date: 01/22/2024 Occupation: @GUAROCC @  Subjective:  Positive ANA  History of Present Illness: Joann Mckay is a 68 y.o. female seen for the evaluation of positive ANA.  Patient states she developed COVID-19 virus infection in 2023.  After that she developed right parotid swelling.  She states she underwent right parotidectomy in August 2023.  She states 2 months later she started experiencing dry mouth.  She states her dry mouth symptoms were very severe.  She was referred to a periodontist who gave her some steroid gel for gum inflammation which helped.  She also made some dietary modifications and her symptoms improved.  She states she does not have dry mouth or dry eye symptoms now.  She states few months later she started having severe diarrhea which persisted.  She had extensive workup in the past.  She states about a month ago she was seen at [facility] gastroenterology where she had an endoscopy and colonoscopy.  Endoscopy showed mild gastritis and colonoscopy collagenous colitis.  She was started on budesonide which has been helpful.  She states her symptoms are improving gradually.  She denies any history of joint inflammation.  She has noticed some changes in her hands over the last few years which causes a stiffness and discomfort.  She also has some discomfort in her knee joints.  None of the other joints are painful.  She denies any history of myalgias.  There is no history of oral ulcers, nasal ulcers, sicca symptoms, malar rash, photosensitivity, Raynaud's or lymphadenopathy.  There is no family history of autoimmune disease.  She is right-handed, retired and used to work as a Veterinary surgeon.  She enjoys walking for exercise.  She used to lift weights in the past which she has not.  She quit smoking in  2023 and is smoked half a pack per day for 38 years in the past.    Activities of Daily Living:  Patient reports morning stiffness for 0 minutes.   Patient Denies nocturnal pain.  Difficulty dressing/grooming: Denies Difficulty climbing stairs: Denies Difficulty getting out of chair: Denies Difficulty using hands for taps, buttons, cutlery, and/or writing: Denies  Review of Systems  Constitutional:  Positive for fatigue.  HENT:  Negative for mouth sores and mouth dryness.   Eyes:  Negative for dryness.  Respiratory:  Negative for shortness of breath.   Cardiovascular:  Negative for chest pain and palpitations.  Gastrointestinal:  Positive for diarrhea. Negative for blood in stool and constipation.  Endocrine: Negative for increased urination.  Genitourinary:  Negative for involuntary urination.  Musculoskeletal:  Positive for joint pain and joint pain. Negative for gait problem, joint swelling, myalgias, muscle weakness, morning stiffness, muscle tenderness and myalgias.  Skin:  Negative for color change, rash, hair loss and sensitivity to sunlight.  Allergic/Immunologic: Negative for susceptible to infections.  Neurological:  Positive for headaches. Negative for dizziness.  Hematological:  Negative for swollen glands.  Psychiatric/Behavioral:  Negative for depressed mood and sleep disturbance. The patient is not nervous/anxious.     PMFS History:  Patient Active Problem List   Diagnosis Date Noted   Prediabetes 05/11/2021   Current smoker 05/11/2021  Hyponatremia 03/31/2021   Family history of colon cancer 12/11/2018   Hyperlipidemia 02/14/2017   Insomnia 02/14/2017   Cigarette nicotine dependence without complication 02/14/2017   COLONIC POLYPS, ADENOMATOUS 08/04/2009   Essential hypertension, benign 08/04/2009    Past Medical History:  Diagnosis Date   Anxiety    Depression    Hypertension    Parotid neoplasm    SIADH (syndrome of inappropriate ADH production)  (HCC)    Toxic shock (HCC) 2006   after tick bite, per patient    Family History  Problem Relation Age of Onset   Colon cancer Mother 49       passed 6 weeks after diagnosis   Cancer Father        lung   Arthritis Sister    Rheum arthritis Paternal Aunt    Past Surgical History:  Procedure Laterality Date   COLONOSCOPY  03/2019   4 mm HP polyp.   COLONOSCOPY  11/2023   LEEP     MANDIBLE FRACTURE SURGERY     PAROTIDECTOMY  12/2021   UPPER GI ENDOSCOPY  11/2023   Social History   Social History Narrative   Not on file   Immunization History  Administered Date(s) Administered   Influenza Split 04/07/2015   Moderna Sars-Covid-2 Vaccination 04/28/2020, 11/08/2020     Objective: Vital Signs: BP 129/83 (BP Location: Right Arm, Patient Position: Sitting, Cuff Size: Normal)   Pulse 69   Resp 16   Ht 5' 6.25 (1.683 m)   Wt 146 lb 9.6 oz (66.5 kg)   BMI 23.48 kg/m    Physical Exam Vitals and nursing note reviewed.  Constitutional:      Appearance: She is well-developed.  HENT:     Head: Normocephalic and atraumatic.  Eyes:     Conjunctiva/sclera: Conjunctivae normal.  Cardiovascular:     Rate and Rhythm: Normal rate and regular rhythm.     Heart sounds: Normal heart sounds.  Pulmonary:     Effort: Pulmonary effort is normal.     Breath sounds: Normal breath sounds.  Abdominal:     General: Bowel sounds are normal.     Palpations: Abdomen is soft.  Musculoskeletal:     Cervical back: Normal range of motion.  Lymphadenopathy:     Cervical: No cervical adenopathy.  Skin:    General: Skin is warm and dry.     Capillary Refill: Capillary refill takes less than 2 seconds.  Neurological:     Mental Status: She is alert and oriented to person, place, and time.  Psychiatric:        Behavior: Behavior normal.      Musculoskeletal Exam: Cervical, thoracic and lumbar spine with good range of motion.  Shoulders, elbows, wrist joints with good range of motion.  She  had bilateral PIP and DIP thickening with no synovitis.  Hip joints and knee joints were in good range of motion without any warmth swelling or effusion.  There was no tenderness over her ankles or MTPs.  Mild prominence of bilateral first MTP joints was noted.  She has some discomfort on squatting and her knee joints.  CDAI Exam: CDAI Score: -- Patient Global: --; Provider Global: -- Swollen: --; Tender: -- Joint Exam 01/22/2024   No joint exam has been documented for this visit   There is currently no information documented on the homunculus. Go to the Rheumatology activity and complete the homunculus joint exam.  Investigation: No additional findings.  Imaging: No results found.  Recent Labs: Lab Results  Component Value Date   WBC 6.6 09/01/2020   HGB 12.1 01/18/2021   PLT 277 09/01/2020   NA 131 (L) 05/06/2021   K 4.4 05/06/2021   CL 97 05/06/2021   CO2 23 05/06/2021   GLUCOSE 89 05/06/2021   BUN 12 05/06/2021   CREATININE 0.80 05/06/2021   BILITOT 0.4 05/06/2021   ALKPHOS 50 05/06/2021   AST 23 05/06/2021   ALT 18 05/06/2021   PROT 6.8 05/06/2021   ALBUMIN 4.8 05/06/2021   CALCIUM 9.4 05/06/2021   May 06, 2021 uric acid 4.9  July 24, 2023 CBC WBC 5.1, hemoglobin 11.8, platelets 305, CMP normal, ANA 1: 40 NS, ENA (dsDNA, Smith, RNP, chromatin, SSA, SSB, SCL 70, Jo 1, centromere, ribosomal P) negative, lipase normal, aldolase normal, vitamin D 50, H. pylori negative, ferritin 12  Records reviewed from GI office [place]: Biopsy from colonoscopy showed collagenous colitis.  Upper endoscopy showed mild gastritis. November 08, 2023 Cryptosporidium negative, Giardia negative, C. difficile negative, pancreatic elastase normal, C-reactive protein less than 5, B12 normal, sed rate 11, UA negative  Speciality Comments: No specialty comments available.  Procedures:  No procedures performed Allergies: Etodolac and Tolmetin   Assessment / Plan:     Visit  Diagnoses: Positive ANA (antinuclear antibody)-ANA is very low titer and not significant.  ENA panel was negative.  Complements were normal.  Patient developed sicca symptoms after parotidectomy but the symptoms resolved after using oral gel and making dietary modifications.  She had good salivary pool on the examination.  No parotid swelling or lymphadenopathy was noted.  There is no history of oral ulcers, nasal ulcers, malar rash, photosensitivity, Raynaud's, inflammatory arthritis or lymphadenopathy.  No further workup is needed at this point.  I advised her to contact me if she develops any new symptoms.  Primary osteoarthritis of both hands-bilateral PIP and DIP thickening was noted.  No synovitis was noted.  Joint protection and muscle strengthening was discussed.  A handout was given.  Detail counseled regarding osteoarthritis was provided.   Chronic pain of both knees-she complains of discomfort in her knee joints.  She has some discomfort when getting up from the squatting position.  No warmth swelling or effusion was noted.  I offered x-rays which she declined.  A handout on lower extremity exercises was given.  Osteopenia of multiple sites - AP Spine L1-L3 is 0.880 g/cm2 with a T-Score of -2.4.  She is not taking calcium and vitamin D.  Use of calcium and vitamin D was discussed.  She should get a repeat DEXA scan and also should consider going on bisphosphonates.  She will discuss it further with her PCP.  A handout on osteopenia was given.  Need for regular exercise and muscle strengthening was discussed.  Vitamin D deficiency-vitamin D was normal at 50 on July 24, 2023.  Collagenous colitis-patient has been experiencing diarrhea for the last 2 years.  She had recent colonoscopy and endoscopy in [place].  I reviewed the results and according to the office note the biopsy of the colon showed collagenous colitis.  Her symptoms are gradually improving on budesonide which she has been using  for 1 month.  Endoscopy also showed mild gastritis.  She is not having any symptoms.  Other medical problems are listed as follows:  Prediabetes  Essential hypertension, benign-blood pressure was normal at 129/83.  Mixed hyperlipidemia  Other insomnia  Anxiety and depression  Former smoker - 1/2 PPD x 38 years.  Quit smoking 2023.  Family history of colon cancer  Orders: No orders of the defined types were placed in this encounter.  No orders of the defined types were placed in this encounter.    Follow-Up Instructions: Return if symptoms worsen or fail to improve, for +ANA, Osteoarthritis.   Maya Nash, MD  Note - This record has been created using Animal nutritionist.  Chart creation errors have been sought, but may not always  have been located. Such creation errors do not reflect on  the standard of medical care.

## 2024-01-22 ENCOUNTER — Ambulatory Visit: Attending: Rheumatology | Admitting: Rheumatology

## 2024-01-22 ENCOUNTER — Encounter: Payer: Self-pay | Admitting: Rheumatology

## 2024-01-22 VITALS — BP 129/83 | HR 69 | Resp 16 | Ht 66.25 in | Wt 146.6 lb

## 2024-01-22 DIAGNOSIS — Z800 Family history of malignant neoplasm of digestive organs: Secondary | ICD-10-CM

## 2024-01-22 DIAGNOSIS — G4709 Other insomnia: Secondary | ICD-10-CM

## 2024-01-22 DIAGNOSIS — F17200 Nicotine dependence, unspecified, uncomplicated: Secondary | ICD-10-CM

## 2024-01-22 DIAGNOSIS — M25561 Pain in right knee: Secondary | ICD-10-CM | POA: Diagnosis not present

## 2024-01-22 DIAGNOSIS — R768 Other specified abnormal immunological findings in serum: Secondary | ICD-10-CM | POA: Diagnosis not present

## 2024-01-22 DIAGNOSIS — D126 Benign neoplasm of colon, unspecified: Secondary | ICD-10-CM

## 2024-01-22 DIAGNOSIS — M19041 Primary osteoarthritis, right hand: Secondary | ICD-10-CM

## 2024-01-22 DIAGNOSIS — M8589 Other specified disorders of bone density and structure, multiple sites: Secondary | ICD-10-CM

## 2024-01-22 DIAGNOSIS — M19042 Primary osteoarthritis, left hand: Secondary | ICD-10-CM

## 2024-01-22 DIAGNOSIS — R7303 Prediabetes: Secondary | ICD-10-CM

## 2024-01-22 DIAGNOSIS — G8929 Other chronic pain: Secondary | ICD-10-CM

## 2024-01-22 DIAGNOSIS — K52831 Collagenous colitis: Secondary | ICD-10-CM

## 2024-01-22 DIAGNOSIS — I10 Essential (primary) hypertension: Secondary | ICD-10-CM

## 2024-01-22 DIAGNOSIS — Z87891 Personal history of nicotine dependence: Secondary | ICD-10-CM

## 2024-01-22 DIAGNOSIS — E782 Mixed hyperlipidemia: Secondary | ICD-10-CM

## 2024-01-22 DIAGNOSIS — E559 Vitamin D deficiency, unspecified: Secondary | ICD-10-CM

## 2024-01-22 DIAGNOSIS — F419 Anxiety disorder, unspecified: Secondary | ICD-10-CM

## 2024-01-22 DIAGNOSIS — M25562 Pain in left knee: Secondary | ICD-10-CM

## 2024-01-22 DIAGNOSIS — F32A Depression, unspecified: Secondary | ICD-10-CM

## 2024-01-22 NOTE — Patient Instructions (Addendum)
 Osteoarthritis  Osteoarthritis is a type of arthritis. It refers to joint pain or joint disease. Osteoarthritis affects tissue that covers the ends of bones in joints (cartilage). Cartilage acts as a cushion between the bones and helps them move smoothly. Osteoarthritis occurs when cartilage in the joints gets worn down. Osteoarthritis is sometimes called wear and tear arthritis. Osteoarthritis is the most common form of arthritis. It often occurs in older people. It is a condition that gets worse over time. The joints most often affected by this condition are in the fingers, toes, hips, knees, and spine, including the neck and lower back. What are the causes? This condition is caused by the wearing down of cartilage that covers the ends of bones. What increases the risk? The following factors may make you more likely to develop this condition: Being age 48 or older. Obesity. Overuse of joints. Past injury of a joint. Past surgery on a joint. Family history of osteoarthritis. What are the signs or symptoms? The main symptoms of this condition are pain, swelling, and stiffness in the joint. Other symptoms may include: An enlarged joint. More pain and further damage caused by small pieces of bone or cartilage that break off and float inside of the joint. Small deposits of bone (osteophytes) that grow on the edges of the joint. A grating or scraping feeling inside the joint when you move it. Popping or creaking sounds when you move. Difficulty walking or exercising. An inability to grip items, twist your hand, or control the movements of your hands and fingers. How is this diagnosed? This condition may be diagnosed based on: Your medical history. A physical exam. Your symptoms. X-rays of the affected joints. Blood tests to rule out other types of arthritis. How is this treated? There is no cure for this condition, but treatment can help control pain and improve joint function. Treatment  may include a combination of therapies, such as: Pain relief techniques, such as: Applying heat and cold to the joint. Massage. A form of talk therapy called cognitive behavioral therapy (CBT). This therapy helps you set goals and follow up on the changes that you make. Medicines for pain and inflammation. The medicines can be taken by mouth or applied to the skin. They include: NSAIDs, such as ibuprofen. Prescription medicines. Strong anti-inflammatory medicines (corticosteroids). Certain nutritional supplements. A prescribed exercise program. You may work with a physical therapist. Assistive devices, such as a brace, wrap, splint, specialized glove, or cane. A weight control plan. Surgery, such as: An osteotomy. This is done to reposition the bones and relieve pain or to remove loose pieces of bone and cartilage. Joint replacement surgery. You may need this surgery if you have advanced osteoarthritis. Follow these instructions at home: Activity Rest your affected joints as told by your health care provider. Exercise as told by your provider. The provider may recommend specific types of exercise, such as: Strengthening exercises. These are done to strengthen the muscles that support joints affected by arthritis. Aerobic activities. These are exercises, such as brisk walking or water aerobics, that increase your heart rate. Range-of-motion activities. These help your joints move more easily. Balance and agility exercises. Managing pain, stiffness, and swelling     If told, apply heat to the affected area as often as told by your provider. Use the heat source that your provider recommends, such as a moist heat pack or a heating pad. If you have a removable assistive device, remove it as told by your provider. Place a  towel between your skin and the heat source. If your provider tells you to keep the assistive device on while you apply heat, place a towel between the assistive device and  the heat source. Leave the heat on for 20-30 minutes. If told, put ice on the affected area. If you have a removable assistive device, remove it as told by your provider. Put ice in a plastic bag. Place a towel between your skin and the bag. If your provider tells you to keep the assistive device on during icing, place a towel between the assistive device and the bag. Leave the ice on for 20 minutes, 2-3 times a day. If your skin turns bright red, remove the ice or heat right away to prevent skin damage. The risk of damage is higher if you cannot feel pain, heat, or cold. Move your fingers or toes often to reduce stiffness and swelling. Raise (elevate) the affected area above the level of your heart while you are sitting or lying down. General instructions Take over-the-counter and prescription medicines only as told by your provider. Maintain a healthy weight. Follow instructions from your provider for weight control. Do not use any products that contain nicotine or tobacco. These products include cigarettes, chewing tobacco, and vaping devices, such as e-cigarettes. If you need help quitting, ask your provider. Use assistive devices as told by your provider. Where to find more information [employer] Arthritis and Musculoskeletal and Skin Diseases: niams.http://www.myers.net/ [employer] on Aging: BaseRingTones.pl American College of Rheumatology: rheumatology.org Contact a health care provider if: You have redness, swelling, or a feeling of warmth in a joint that gets worse. You have a fever along with joint or muscle aches. You develop a rash. You have trouble doing your normal activities. You have pain that gets worse and is not relieved by pain medicine. This information is not intended to replace advice given to you by your health care provider. Make sure you discuss any questions you have with your health care provider. Document Revised: 01/12/2022 Document Reviewed:  01/12/2022 Else[employer] Patient Education  2024 [employer] Hand Exercises Hand exercises can be helpful for almost anyone. They can strengthen your hands and improve flexibility and movement. The exercises can also increase blood flow to the hands. These results can make your work and daily tasks easier for you. Hand exercises can be especially helpful for people who have joint pain from arthritis or nerve damage from using their hands over and over. These exercises can also help people who injure a hand. Exercises Most of these hand exercises are gentle stretching and motion exercises. It is usually safe to do them often throughout the day. Warming up your hands before exercise may help reduce stiffness. You can do this with gentle massage or by placing your hands in warm water for 10-15 minutes. It is normal to feel some stretching, pulling, tightness, or mild discomfort when you begin new exercises. In time, this will improve. Remember to always be careful and stop right away if you feel sudden, very bad pain or your pain gets worse. You want to get better and be safe. Ask your health care provider which exercises are safe for you. Do exercises exactly as told by your provider and adjust them as told. Do not begin these exercises until told by your provider. Knuckle bend or claw fist  Stand or sit with your arm, hand, and all five fingers pointed straight up. Make sure to keep your wrist straight. Gently bend  your fingers down toward your palm until the tips of your fingers are touching your palm. Keep your big knuckle straight and only bend the small knuckles in your fingers. Hold this position for 10 seconds. Straighten your fingers back to your starting position. Repeat this exercise 5-10 times with each hand. Full finger fist  Stand or sit with your arm, hand, and all five fingers pointed straight up. Make sure to keep your wrist straight. Gently bend your fingers into your palm until  the tips of your fingers are touching the middle of your palm. Hold this position for 10 seconds. Extend your fingers back to your starting position, stretching every joint fully. Repeat this exercise 5-10 times with each hand. Straight fist  Stand or sit with your arm, hand, and all five fingers pointed straight up. Make sure to keep your wrist straight. Gently bend your fingers at the big knuckle, where your fingers meet your hand, and at the middle knuckle. Keep the knuckle at the tips of your fingers straight and try to touch the bottom of your palm. Hold this position for 10 seconds. Extend your fingers back to your starting position, stretching every joint fully. Repeat this exercise 5-10 times with each hand. Tabletop  Stand or sit with your arm, hand, and all five fingers pointed straight up. Make sure to keep your wrist straight. Gently bend your fingers at the big knuckle, where your fingers meet your hand, as far down as you can. Keep the small knuckles in your fingers straight. Think of forming a tabletop with your fingers. Hold this position for 10 seconds. Extend your fingers back to your starting position, stretching every joint fully. Repeat this exercise 5-10 times with each hand. Finger spread  Place your hand flat on a table with your palm facing down. Make sure your wrist stays straight. Spread your fingers and thumb apart from each other as far as you can until you feel a gentle stretch. Hold this position for 10 seconds. Bring your fingers and thumb tight together again. Hold this position for 10 seconds. Repeat this exercise 5-10 times with each hand. Making circles  Stand or sit with your arm, hand, and all five fingers pointed straight up. Make sure to keep your wrist straight. Make a circle by touching the tip of your thumb to the tip of your index finger. Hold for 10 seconds. Then open your hand wide. Repeat this motion with your thumb and each of your  fingers. Repeat this exercise 5-10 times with each hand. Thumb motion  Sit with your forearm resting on a table and your wrist straight. Your thumb should be facing up toward the ceiling. Keep your fingers relaxed as you move your thumb. Lift your thumb up as high as you can toward the ceiling. Hold for 10 seconds. Bend your thumb across your palm as far as you can, reaching the tip of your thumb for the small finger (pinkie) side of your palm. Hold for 10 seconds. Repeat this exercise 5-10 times with each hand. Grip strengthening  Hold a stress ball or other soft ball in the middle of your hand. Slowly increase the pressure, squeezing the ball as much as you can without causing pain. Think of bringing the tips of your fingers into the middle of your palm. All of your finger joints should bend when doing this exercise. Hold your squeeze for 10 seconds, then relax. Repeat this exercise 5-10 times with each hand. Contact a health care  provider if: Your hand pain or discomfort gets much worse when you do an exercise. Your hand pain or discomfort does not improve within 2 hours after you exercise. If you have either of these problems, stop doing these exercises right away. Do not do them again unless your provider says that you can. Get help right away if: You develop sudden, severe hand pain or swelling. If this happens, stop doing these exercises right away. Do not do them again unless your provider says that you can. This information is not intended to replace advice given to you by your health care provider. Make sure you discuss any questions you have with your health care provider. Document Revised: 05/30/2022 Document Reviewed: 05/30/2022 Elsevier Patient Education  2024 [employer]  Exercises for Chronic Knee Pain Chronic knee pain is pain that lasts longer than 3 months. For most people with chronic knee pain, exercise and weight loss is an important part of treatment. Your health  care provider may want you to focus on: Making the muscles that support your knee stronger. This can take pressure off your knee and reduce pain. Preventing knee stiffness. How far you can move your knee, keeping it there or making it farther. Losing weight (if this applies) to take pressure off your knee, lower your risk for injury, and make it easier for you to exercise. Your provider will help you make an exercise program that fits your needs and physical abilities. Below are simple, low-impact exercises you can do at home. Ask your provider or physical therapist how often you should do your exercise program and how many times to repeat each exercise. General safety tips  Get your provider's approval before doing any exercises. Start slowly and stop any time you feel pain. Do not exercise if your knee pain is flaring up. Warm up first. Stretching a cold muscle can cause an injury. Do 5-10 minutes of easy movement or light stretching before beginning your exercises. Do 5-10 minutes of low-impact activity (like walking or cycling) before starting strengthening exercises. Contact your provider any time you have pain during or after exercising. Exercise can cause discomfort but should not be painful. It is normal to be a little stiff or sore after exercising. Stretching and range-of-motion exercises Front thigh stretch  Stand up straight and support your body by holding on to a chair or resting one hand on a wall. With your legs straight and close together, bend one knee to lift your heel up toward your butt. Using one hand for support, grab your ankle with your free hand. Pull your foot up closer toward your butt to feel the stretch in front of your thigh. Hold the stretch for 30 seconds. Repeat __________ times. Complete this exercise __________ times a day. Back thigh stretch  Sit on the floor with your back straight and your legs out straight in front of you. Place the palms of your  hands on the floor and slide them toward your feet as you bend at the hip. Try to touch your nose to your knees and feel the stretch in the back of your thighs. Hold for 30 seconds. Repeat __________ times. Complete this exercise __________ times a day. Calf stretch  Stand facing a wall. Place the palms of your hands flat against the wall, arms extended, and lean slightly against the wall. Get into a lunge position with one leg bent at the knee and the other leg stretched out straight behind you. Keep both feet facing  the wall and increase the bend in your knee while keeping the heel of the other leg flat on the ground. You should feel the stretch in your calf. Hold for 30 seconds. Repeat __________ times. Complete this exercise __________ times a day. Strengthening exercises Straight leg lift  Lie on your back with one knee bent and the other leg out straight. Slowly lift the straight leg without bending the knee. Lift until your foot is about 12 inches (30 cm) off the floor. Hold for 3-5 seconds and slowly lower your leg. Repeat __________ times. Complete this exercise __________ times a day. Single leg dip  Stand between two chairs and put both hands on the backs of the chairs for support. Extend one leg out straight with your body weight resting on the heel of the standing leg. Slowly bend your standing knee to dip your body to the level that is comfortable for you. Hold for 3-5 seconds. Repeat __________ times. Complete this exercise __________ times a day. Hamstring curls  Stand straight, knees close together, facing the back of a chair. Hold on to the back of a chair with both hands. Keep one leg straight. Bend the other knee while bringing the heel up toward the butt until the knee is bent at a 90-degree angle (right angle). Hold for 3-5 seconds. Repeat __________ times. Complete this exercise __________ times a day. Wall squat  Stand straight with your back, hips, and  head against a wall. Step forward one foot at a time with your back still against the wall. Your feet should be 2 feet (61 cm) from the wall at shoulder width. Keeping your back, hips, and head against the wall, slide down the wall to as close to a sitting position as you can get. Hold for 5-10 seconds, then slowly slide back up. Repeat __________ times. Complete this exercise __________ times a day. Step-ups  Stand in front of a sturdy platform or stool that is about 6 inches (15 cm) high. Slowly step up with your left / right foot, keeping your knee in line with your hip and foot. Do not let your knee bend so far that you cannot see your toes. Hold on to a chair for balance, but do not use it for support. Slowly unlock your knee and lower yourself to the starting position. Repeat __________ times. Complete this exercise __________ times a day. Contact a health care provider if: Your exercises cause pain. Your pain is worse after you exercise. Your pain prevents you from doing your exercises. This information is not intended to replace advice given to you by your health care provider. Make sure you discuss any questions you have with your health care provider. Document Revised: 05/30/2022 Document Reviewed: 05/30/2022 Elsevier Patient Education  2024 [employer] Osteopenia  Osteopenia is a loss of thickness (density) inside the bones. Another name for osteopenia is low bone mass. Mild osteopenia is a normal part of aging. It is not a disease, and it does not cause symptoms. However, if you have osteopenia and continue to lose bone mass, you could develop a condition that causes the bones to become thin and break more easily (osteoporosis). Osteoporosis can cause you to lose some height, have back pain, and have a stooped posture. Although osteopenia is not a disease, making changes to your lifestyle and diet can help to prevent osteopenia from developing into osteoporosis. What are the  causes? Osteopenia is caused by loss of calcium in the bones. Bones are  constantly changing. Old bone cells are continually being replaced with new bone cells. This process builds new bone. The mineral calcium is needed to build new bone and maintain bone density. Bone density is usually highest around age 61. After that, most people's bodies cannot replace all the bone they have lost with new bone. What increases the risk? You are more likely to develop this condition if: You are older than age 86. You are a woman who went through menopause early. You have a long illness that keeps you in bed. You do not get enough exercise. You lack certain nutrients (malnutrition). You have an overactive thyroid gland (hyperthyroidism). You use products that contain nicotine or tobacco, such as cigarettes, e-cigarettes and chewing tobacco, or you drink a lot of alcohol. You are taking medicines that weaken the bones, such as steroids. What are the signs or symptoms? This condition does not cause any symptoms. You may have a slightly higher risk for bone breaks (fractures), so getting fractures more easily than normal may be an indication of osteopenia. How is this diagnosed? This condition may be diagnosed based on an X-ray exam that measures bone density (dual-energy X-ray absorptiometry, or DEXA). This test can measure bone density in your hips, spine, and wrists. Osteopenia has no symptoms, so this condition is usually diagnosed after a routine bone density screening test is done for osteoporosis. This routine screening is usually done for: Women who are age 7 or older. Men who are age 35 or older. If you have risk factors for osteopenia, you may have the screening test at an earlier age. How is this treated? Making dietary and lifestyle changes can lower your risk for osteoporosis. If you have severe osteopenia that is close to becoming osteoporosis, this condition can be treated with medicines and  dietary supplements such as calcium and vitamin D. These supplements help to rebuild bone density. Follow these instructions at home: Eating and drinking Eat a diet that is high in calcium and vitamin D. Calcium is found in dairy products, beans, salmon, and leafy green vegetables like spinach and broccoli. Look for foods that have vitamin D and calcium added to them (fortified foods), such as orange juice, cereal, and bread.  Lifestyle Do 30 minutes or more of a weight-bearing exercise every day, such as walking, jogging, or playing a sport. These types of exercises strengthen the bones. Do not use any products that contain nicotine or tobacco, such as cigarettes, e-cigarettes, and chewing tobacco. If you need help quitting, ask your health care provider. Do not drink alcohol if: Your health care provider tells you not to drink. You are pregnant, may be pregnant, or are planning to become pregnant. If you drink alcohol: Limit how much you use to: 0-1 drink a day for women. 0-2 drinks a day for men. Be aware of how much alcohol is in your drink. In the [place] one drink equals one 12 oz bottle of beer (355 mL), one 5 oz glass of wine (148 mL), or one 1 oz glass of hard liquor (44 mL). General instructions Take over-the-counter and prescription medicines only as told by your health care provider. These include vitamins and supplements. Take precautions at home to lower your risk of falling, such as: Keeping rooms well-lit and free of clutter, such as cords. Installing safety rails on stairs. Using rubber mats in the bathroom or other areas that are often wet or slippery. Keep all follow-up visits. This is important. Contact a health care provider  if: You have not had a bone density screening for osteoporosis and you are: A woman who is age 68 or older. A man who is age 44 or older. You are a postmenopausal woman who has not had a bone density screening for osteoporosis. You are older than  age 62 and you want to know if you should have bone density screening for osteoporosis. Summary Osteopenia is a loss of thickness (density) inside the bones. Another name for osteopenia is low bone mass. Osteopenia is not a disease, but it may increase your risk for a condition that causes the bones to become thin and break more easily (osteoporosis). You may be at risk for osteopenia if you are older than age 32 or if you are a woman who went through early menopause. Osteopenia does not cause any symptoms, but it can be diagnosed with a bone density screening test. Dietary and lifestyle changes are the first treatment for osteopenia. These may lower your risk for osteoporosis. This information is not intended to replace advice given to you by your health care provider. Make sure you discuss any questions you have with your health care provider. Document Revised: 01/30/2023 Document Reviewed: 01/30/2023 Elsevier Patient Education  2025 [employer].

## 2024-02-04 ENCOUNTER — Other Ambulatory Visit (HOSPITAL_COMMUNITY): Payer: Self-pay | Admitting: Family Medicine

## 2024-02-04 DIAGNOSIS — M8580 Other specified disorders of bone density and structure, unspecified site: Secondary | ICD-10-CM

## 2024-02-12 ENCOUNTER — Ambulatory Visit (HOSPITAL_COMMUNITY)
Admission: RE | Admit: 2024-02-12 | Discharge: 2024-02-12 | Disposition: A | Discharge status: Home / Self Care | Source: Ambulatory Visit | Attending: Family Medicine | Admitting: Family Medicine

## 2024-02-12 DIAGNOSIS — M8580 Other specified disorders of bone density and structure, unspecified site: Secondary | ICD-10-CM | POA: Diagnosis present

## 2024-02-12 DIAGNOSIS — M810 Age-related osteoporosis without current pathological fracture: Secondary | ICD-10-CM | POA: Diagnosis not present

## 2024-02-12 DIAGNOSIS — Z13820 Encounter for screening for osteoporosis: Secondary | ICD-10-CM | POA: Insufficient documentation

## 2024-02-14 ENCOUNTER — Ambulatory Visit: Admitting: Rheumatology

## 2024-02-20 ENCOUNTER — Ambulatory Visit: Admitting: Rheumatology

## 2024-04-03 ENCOUNTER — Other Ambulatory Visit (HOSPITAL_COMMUNITY): Payer: Self-pay | Admitting: Family Medicine

## 2024-04-03 DIAGNOSIS — Z1231 Encounter for screening mammogram for malignant neoplasm of breast: Secondary | ICD-10-CM

## 2024-04-18 NOTE — Progress Notes (Deleted)
 Office Visit Note  Patient: Joann Mckay             Date of Birth: [birth year]           MRN: [id number]             PCP: Alston Silvio BROCKS, FNP Referring: Bucio, Elsa C, FNP Visit Date: 05/02/2024 Occupation: Data Unavailable  Subjective:  No chief complaint on file.   History of Present Illness: Joann Mckay is a 68 y.o. female ***     Activities of Daily Living:  Patient reports morning stiffness for *** {minute/hour:19697}.   Patient {ACTIONS;DENIES/REPORTS:21021675::Denies} nocturnal pain.  Difficulty dressing/grooming: {ACTIONS;DENIES/REPORTS:21021675::Denies} Difficulty climbing stairs: {ACTIONS;DENIES/REPORTS:21021675::Denies} Difficulty getting out of chair: {ACTIONS;DENIES/REPORTS:21021675::Denies} Difficulty using hands for taps, buttons, cutlery, and/or writing: {ACTIONS;DENIES/REPORTS:21021675::Denies}  No Rheumatology ROS completed.   PMFS History:  Patient Active Problem List   Diagnosis Date Noted   Prediabetes 05/11/2021   Current smoker 05/11/2021   Hyponatremia 03/31/2021   Family history of colon cancer 12/11/2018   Hyperlipidemia 02/14/2017   Insomnia 02/14/2017   Cigarette nicotine dependence without complication 02/14/2017   COLONIC POLYPS, ADENOMATOUS 08/04/2009   Essential hypertension, benign 08/04/2009    Past Medical History:  Diagnosis Date   Anxiety    Depression    Hypertension    Parotid neoplasm    SIADH (syndrome of inappropriate ADH production)    Toxic shock (HCC) 2006   after tick bite, per patient    Family History  Problem Relation Age of Onset   Colon cancer Mother 44       passed 6 weeks after diagnosis   Cancer Father        lung   Arthritis Sister    Rheum arthritis Paternal Aunt    Past Surgical History:  Procedure Laterality Date   COLONOSCOPY  03/2019   4 mm HP polyp.   COLONOSCOPY  11/2023   LEEP     MANDIBLE FRACTURE SURGERY     PAROTIDECTOMY  12/2021   UPPER GI ENDOSCOPY  11/2023    Social History   Tobacco Use   Smoking status: Former    Current packs/day: 0.50    Average packs/day: 0.5 packs/day for 38.0 years (19.0 ttl pk-yrs)    Types: Cigarettes    Passive exposure: Never   Smokeless tobacco: Never  Vaping Use   Vaping status: Never Used  Substance Use Topics   Alcohol use: No   Drug use: No   Social History   Social History Narrative   Not on file     Immunization History  Administered Date(s) Administered   Influenza Split 04/07/2015   Moderna Sars-Covid-2 Vaccination 04/28/2020, 11/08/2020     Objective: Vital Signs: There were no vitals taken for this visit.   Physical Exam   Musculoskeletal Exam: ***  CDAI Exam: CDAI Score: -- Patient Global: --; Provider Global: -- Swollen: --; Tender: -- Joint Exam 05/02/2024   No joint exam has been documented for this visit   There is currently no information documented on the homunculus. Go to the Rheumatology activity and complete the homunculus joint exam.  Investigation: No additional findings.  Imaging: No results found.  Recent Labs: Lab Results  Component Value Date   WBC 6.6 09/01/2020   HGB 12.1 01/18/2021   PLT 277 09/01/2020   NA 131 (L) 05/06/2021   K 4.4 05/06/2021   CL 97 05/06/2021   CO2 23 05/06/2021   GLUCOSE 89 05/06/2021   BUN 12 05/06/2021  CREATININE 0.80 05/06/2021   BILITOT 0.4 05/06/2021   ALKPHOS 50 05/06/2021   AST 23 05/06/2021   ALT 18 05/06/2021   PROT 6.8 05/06/2021   ALBUMIN 4.8 05/06/2021   CALCIUM 9.4 05/06/2021    Speciality Comments: No specialty comments available.  Procedures:  No procedures performed Allergies: Etodolac and Tolmetin   Assessment / Plan:     Visit Diagnoses: No diagnosis found.  Orders: No orders of the defined types were placed in this encounter.  No orders of the defined types were placed in this encounter.   Face-to-face time spent with patient was *** minutes. Greater than 50% of time was spent in  counseling and coordination of care.  Follow-Up Instructions: No follow-ups on file.   Maya Nash, MD  Note - This record has been created using Animal nutritionist.  Chart creation errors have been sought, but may not always  have been located. Such creation errors do not reflect on  the standard of medical care.

## 2024-04-29 NOTE — Progress Notes (Deleted)
 Office Visit Note  Patient: Joann Mckay             Date of Birth: December 01, 1955           MRN: [id number]             PCP: Alston Silvio BROCKS, FNP Referring: Bucio, Elsa C, FNP Visit Date: 05/06/2024 Occupation: Data Unavailable  Subjective:  No chief complaint on file.   History of Present Illness: Joann Mckay is a 68 y.o. female ***     Activities of Daily Living:  Patient reports morning stiffness for *** {minute/hour:19697}.   Patient {ACTIONS;DENIES/REPORTS:21021675::Denies} nocturnal pain.  Difficulty dressing/grooming: {ACTIONS;DENIES/REPORTS:21021675::Denies} Difficulty climbing stairs: {ACTIONS;DENIES/REPORTS:21021675::Denies} Difficulty getting out of chair: {ACTIONS;DENIES/REPORTS:21021675::Denies} Difficulty using hands for taps, buttons, cutlery, and/or writing: {ACTIONS;DENIES/REPORTS:21021675::Denies}  No Rheumatology ROS completed.   PMFS History:  Patient Active Problem List   Diagnosis Date Noted   Prediabetes 05/11/2021   Current smoker 05/11/2021   Hyponatremia 03/31/2021   Family history of colon cancer 12/11/2018   Hyperlipidemia 02/14/2017   Insomnia 02/14/2017   Cigarette nicotine dependence without complication 02/14/2017   COLONIC POLYPS, ADENOMATOUS 08/04/2009   Essential hypertension, benign 08/04/2009    Past Medical History:  Diagnosis Date   Anxiety    Depression    Hypertension    Parotid neoplasm    SIADH (syndrome of inappropriate ADH production)    Toxic shock (HCC) 2006   after tick bite, per patient    Family History  Problem Relation Age of Onset   Colon cancer Mother 75       passed 6 weeks after diagnosis   Cancer Father        lung   Arthritis Sister    Rheum arthritis Paternal Aunt    Past Surgical History:  Procedure Laterality Date   COLONOSCOPY  03/2019   4 mm HP polyp.   COLONOSCOPY  11/2023   LEEP     MANDIBLE FRACTURE SURGERY     PAROTIDECTOMY  12/2021   UPPER GI ENDOSCOPY  11/2023    Social History   Tobacco Use   Smoking status: Former    Current packs/day: 0.50    Average packs/day: 0.5 packs/day for 38.0 years (19.0 ttl pk-yrs)    Types: Cigarettes    Passive exposure: Never   Smokeless tobacco: Never  Vaping Use   Vaping status: Never Used  Substance Use Topics   Alcohol use: No   Drug use: No   Social History   Social History Narrative   Not on file     Immunization History  Administered Date(s) Administered   Influenza Split 04/07/2015   Moderna Sars-Covid-2 Vaccination 04/28/2020, 11/08/2020     Objective: Vital Signs: There were no vitals taken for this visit.   Physical Exam   Musculoskeletal Exam: ***  CDAI Exam: CDAI Score: -- Patient Global: --; Provider Global: -- Swollen: --; Tender: -- Joint Exam 05/06/2024   No joint exam has been documented for this visit   There is currently no information documented on the homunculus. Go to the Rheumatology activity and complete the homunculus joint exam.  Investigation: No additional findings.  Imaging: No results found.  Recent Labs: Lab Results  Component Value Date   WBC 6.6 09/01/2020   HGB 12.1 01/18/2021   PLT 277 09/01/2020   NA 131 (L) 05/06/2021   K 4.4 05/06/2021   CL 97 05/06/2021   CO2 23 05/06/2021   GLUCOSE 89 05/06/2021   BUN 12 05/06/2021  CREATININE 0.80 05/06/2021   BILITOT 0.4 05/06/2021   ALKPHOS 50 05/06/2021   AST 23 05/06/2021   ALT 18 05/06/2021   PROT 6.8 05/06/2021   ALBUMIN 4.8 05/06/2021   CALCIUM 9.4 05/06/2021     May 06, 2021 uric acid 4.9   July 24, 2023 CBC WBC 5.1, hemoglobin 11.8, platelets 305, CMP normal, ANA 1: 40 NS, ENA (dsDNA, Smith, RNP, chromatin, SSA, SSB, SCL 70, Jo 1, centromere, ribosomal P) negative, lipase normal, aldolase normal, vitamin D 50, H. pylori negative, ferritin 12   Records reviewed from GI office [place] [facility]: Biopsy from colonoscopy showed collagenous colitis.  Upper endoscopy showed mild  gastritis. November 08, 2023 Cryptosporidium negative, Giardia negative, C. difficile negative, pancreatic elastase normal, C-reactive protein less than 5, B12 normal, sed rate 11, UA negative  Speciality Comments: No specialty comments available.  Procedures:  No procedures performed Allergies: Etodolac and Tolmetin   Assessment / Plan:     Visit Diagnoses: No diagnosis found.  Orders: No orders of the defined types were placed in this encounter.  No orders of the defined types were placed in this encounter.   Face-to-face time spent with patient was *** minutes. Greater than 50% of time was spent in counseling and coordination of care.  Follow-Up Instructions: No follow-ups on file.   Maya Nash, MD  Note - This record has been created using Animal nutritionist.  Chart creation errors have been sought, but may not always  have been located. Such creation errors do not reflect on  the standard of medical care.

## 2024-05-02 ENCOUNTER — Ambulatory Visit: Admitting: Rheumatology

## 2024-05-02 DIAGNOSIS — R7689 Other specified abnormal immunological findings in serum: Secondary | ICD-10-CM

## 2024-05-02 DIAGNOSIS — E559 Vitamin D deficiency, unspecified: Secondary | ICD-10-CM

## 2024-05-02 DIAGNOSIS — G4709 Other insomnia: Secondary | ICD-10-CM

## 2024-05-02 DIAGNOSIS — M8589 Other specified disorders of bone density and structure, multiple sites: Secondary | ICD-10-CM

## 2024-05-02 DIAGNOSIS — I10 Essential (primary) hypertension: Secondary | ICD-10-CM

## 2024-05-02 DIAGNOSIS — Z87891 Personal history of nicotine dependence: Secondary | ICD-10-CM

## 2024-05-02 DIAGNOSIS — R7303 Prediabetes: Secondary | ICD-10-CM

## 2024-05-02 DIAGNOSIS — Z800 Family history of malignant neoplasm of digestive organs: Secondary | ICD-10-CM

## 2024-05-02 DIAGNOSIS — F419 Anxiety disorder, unspecified: Secondary | ICD-10-CM

## 2024-05-02 DIAGNOSIS — M19041 Primary osteoarthritis, right hand: Secondary | ICD-10-CM

## 2024-05-02 DIAGNOSIS — K52831 Collagenous colitis: Secondary | ICD-10-CM

## 2024-05-02 DIAGNOSIS — E782 Mixed hyperlipidemia: Secondary | ICD-10-CM

## 2024-05-02 DIAGNOSIS — G8929 Other chronic pain: Secondary | ICD-10-CM

## 2024-05-05 NOTE — Progress Notes (Signed)
 This encounter was created in error - please disregard.

## 2024-05-06 ENCOUNTER — Ambulatory Visit: Admitting: Rheumatology

## 2024-05-06 DIAGNOSIS — E559 Vitamin D deficiency, unspecified: Secondary | ICD-10-CM

## 2024-05-06 DIAGNOSIS — K52831 Collagenous colitis: Secondary | ICD-10-CM

## 2024-05-06 DIAGNOSIS — E782 Mixed hyperlipidemia: Secondary | ICD-10-CM

## 2024-05-06 DIAGNOSIS — I10 Essential (primary) hypertension: Secondary | ICD-10-CM

## 2024-05-06 DIAGNOSIS — M8589 Other specified disorders of bone density and structure, multiple sites: Secondary | ICD-10-CM

## 2024-05-06 DIAGNOSIS — Z87891 Personal history of nicotine dependence: Secondary | ICD-10-CM

## 2024-05-06 DIAGNOSIS — G8929 Other chronic pain: Secondary | ICD-10-CM

## 2024-05-06 DIAGNOSIS — G4709 Other insomnia: Secondary | ICD-10-CM

## 2024-05-06 DIAGNOSIS — M19041 Primary osteoarthritis, right hand: Secondary | ICD-10-CM

## 2024-05-06 DIAGNOSIS — R7689 Other specified abnormal immunological findings in serum: Secondary | ICD-10-CM

## 2024-05-06 DIAGNOSIS — Z800 Family history of malignant neoplasm of digestive organs: Secondary | ICD-10-CM

## 2024-05-06 DIAGNOSIS — R7303 Prediabetes: Secondary | ICD-10-CM

## 2024-05-06 DIAGNOSIS — F419 Anxiety disorder, unspecified: Secondary | ICD-10-CM

## 2024-05-12 ENCOUNTER — Inpatient Hospital Stay (HOSPITAL_COMMUNITY): Admission: RE | Admit: 2024-05-12 | Discharge: 2024-05-12 | Attending: Family Medicine

## 2024-05-12 DIAGNOSIS — Z1231 Encounter for screening mammogram for malignant neoplasm of breast: Secondary | ICD-10-CM | POA: Diagnosis present

## 2024-07-30 ENCOUNTER — Ambulatory Visit: Admitting: Rheumatology
